# Patient Record
Sex: Female | Born: 1995 | Race: White | Hispanic: No | Marital: Single | Smoking: Never smoker
Health system: Southern US, Community
[De-identification: ages and names within clinical notes are randomized; demographics above are authoritative.]

## PROBLEM LIST (undated history)

## (undated) DIAGNOSIS — R011 Cardiac murmur, unspecified: Secondary | ICD-10-CM

## (undated) DIAGNOSIS — B9689 Other specified bacterial agents as the cause of diseases classified elsewhere: Secondary | ICD-10-CM

## (undated) DIAGNOSIS — F909 Attention-deficit hyperactivity disorder, unspecified type: Secondary | ICD-10-CM

---
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
## 2018-10-17 ENCOUNTER — Encounter (HOSPITAL_COMMUNITY): Payer: Self-pay

## 2018-10-17 ENCOUNTER — Ambulatory Visit (HOSPITAL_COMMUNITY)
Admission: EM | Admit: 2018-10-17 | Discharge: 2018-10-17 | Disposition: A | Discharge status: Home / Self Care | Payer: Medicaid Other | Attending: Family Medicine | Admitting: Family Medicine

## 2018-10-17 ENCOUNTER — Other Ambulatory Visit: Payer: Self-pay

## 2018-10-17 DIAGNOSIS — H1013 Acute atopic conjunctivitis, bilateral: Secondary | ICD-10-CM

## 2018-10-17 HISTORY — DX: Attention-deficit hyperactivity disorder, unspecified type: F90.9

## 2018-10-17 MED ORDER — OLOPATADINE HCL 0.2 % OP SOLN: 1.0000 [drp] | Freq: Every day | OPHTHALMIC | 0 refills | Status: AC

## 2018-10-17 MED ORDER — CETIRIZINE HCL 10 MG PO TABS: 10.0000 mg | ORAL_TABLET | Freq: Every day | ORAL | 0 refills | Status: AC

## 2018-10-17 NOTE — ED Provider Notes (Signed)
[facility]    CSN: [id number] Arrival date & time: 10/17/18  1138      History   Chief Complaint Chief Complaint  Patient presents with  . allergies    HPI Alice Mann is a 23 y.o. female.   HPI  Patient complains of allergies.  Itchy watery eyes for 3 or 4 days.  No purulent discharge.  No photophobia.  No visual defect.  No purulent discharge.  Mild runny nose.  Has not taken any medication.  Is otherwise healthy  Past Medical History:  Diagnosis Date  . ADHD   . Heart murmur     There are no active problems to display for this patient.   History reviewed. No pertinent surgical history.  OB History   No obstetric history on file.      Home Medications    Prior to Admission medications   Medication Sig Start Date End Date Taking? Authorizing Provider  cetirizine (ZYRTEC) 10 MG tablet Take 1 tablet (10 mg total) by mouth daily. 10/17/18   Eustace MooreNelson, Tadeusz Stahl Sue, MD  lisdexamfetamine (VYVANSE) 30 MG capsule Take 30 mg by mouth every morning.      [provider]  Olopatadine HCl 0.2 % SOLN Apply 1 drop to eye daily. 10/17/18   Eustace MooreNelson, Eirik Schueler Sue, MD    Family History Family History  Problem Relation Age of Onset  . Healthy Mother   . Healthy Father     Social History Social History   Tobacco Use  . Smoking status: Never Smoker  . Smokeless tobacco: Never Used  Substance Use Topics  . Alcohol use: Never    Frequency: Never  . Drug use: Never     Allergies   Patient has no known allergies.   Review of Systems Review of Systems  Constitutional: Negative for chills and fever.  HENT: Positive for rhinorrhea. Negative for ear pain and sore throat.   Eyes: Positive for itching. Negative for photophobia, pain, discharge, redness and visual disturbance.  Respiratory: Negative for cough and shortness of breath.   Cardiovascular: Negative for chest pain and palpitations.  Gastrointestinal: Negative for abdominal pain and vomiting.   Genitourinary: Negative for dysuria and hematuria.  Musculoskeletal: Negative for arthralgias and back pain.  Skin: Negative for color change and rash.  Neurological: Negative for seizures and syncope.  All other systems reviewed and are negative.    Physical Exam Triage Vital Signs ED Triage Vitals [10/17/18 1220]  Enc Vitals Group     BP 104/68     Pulse Rate 68     Resp 16     Temp 98 F (36.7 C)     Temp Source Oral     SpO2 98 %     Weight 111 lb (50.3 kg)     Height      Head Circumference      Peak Flow      Pain Score 2     Pain Loc      Pain Edu?      Excl. in GC?    No data found.  Updated Vital Signs BP 104/68 (BP Location: Right Arm)   Pulse 68   Temp 98 F (36.7 C) (Oral)   Resp 16   Wt 50.3 kg   LMP 10/12/2018   SpO2 98%      Physical Exam Constitutional:      General: She is not in acute distress.    Appearance: She is well-developed.  HENT:  Head: Normocephalic and atraumatic.     Right Ear: Tympanic membrane and ear canal normal.     Left Ear: Tympanic membrane and ear canal normal.     Nose: Nose normal. No congestion.     Mouth/Throat:     Mouth: Mucous membranes are moist.  Eyes:     Conjunctiva/sclera: Conjunctivae normal.     Pupils: Pupils are equal, round, and reactive to light.     Comments: Minimal eye findings.  No conjunctival injection.  No discharge.  Neck:     Musculoskeletal: Normal range of motion.  Cardiovascular:     Rate and Rhythm: Normal rate.  Pulmonary:     Effort: Pulmonary effort is normal. No respiratory distress.  Abdominal:     General: There is no distension.     Palpations: Abdomen is soft.  Musculoskeletal: Normal range of motion.  Skin:    General: Skin is warm and dry.  Neurological:     General: No focal deficit present.     Mental Status: She is alert.  Psychiatric:        Mood and Affect: Mood normal.      UC Treatments / Results  Labs (all labs ordered are listed, but only abnormal  results are displayed) Labs Reviewed - No data to display  EKG None  Radiology No results found.  Procedures Procedures (including critical care time)  Medications Ordered in UC Medications - No data to display  Initial Impression / Assessment and Plan / UC Course  I have reviewed the triage vital signs and the nursing notes.  Pertinent labs & imaging results that were available during my care of the patient were reviewed by me and considered in my medical decision making (see chart for details).     Offered allergy treatment and advised.  Return. Final Clinical Impressions(s) / UC Diagnoses   Final diagnoses:  Allergic conjunctivitis of both eyes     Discharge Instructions     Take Zyrtec once a day for allergy symptoms Use allergy eyedrops as needed.  Use every day that you have symptoms. Drink plenty of water Call or return if not improving   ED Prescriptions    Medication Sig Dispense Auth. Provider   cetirizine (ZYRTEC) 10 MG tablet Take 1 tablet (10 mg total) by mouth daily. 30 tablet Raylene Everts, MD   Olopatadine HCl 0.2 % SOLN Apply 1 drop to eye daily. 2.5 mL Raylene Everts, MD     Controlled Substance Prescriptions [place] Controlled Substance Registry consulted? Not Applicable   Raylene Everts, MD 10/17/18 1321

## 2018-10-17 NOTE — ED Triage Notes (Signed)
Pt states states her eyes have been itching and watery eyes x 4 days.

## 2018-10-17 NOTE — Discharge Instructions (Signed)
Take Zyrtec once a day for allergy symptoms Use allergy eyedrops as needed.  Use every day that you have symptoms. Drink plenty of water Call or return if not improving

## 2019-09-22 ENCOUNTER — Ambulatory Visit: Payer: Medicaid Other | Attending: Internal Medicine

## 2019-09-22 ENCOUNTER — Ambulatory Visit: Payer: Medicaid Other

## 2019-09-22 DIAGNOSIS — Z23 Encounter for immunization: Secondary | ICD-10-CM

## 2019-09-22 NOTE — Progress Notes (Signed)
   Covid-19 Vaccination Clinic  Name:  Alice Mann    MRN: [id number] DOB: 1995/12/27  09/22/2019  [name] was observed post Covid-19 immunization for 15 minutes without incident. She was provided with Vaccine Information Sheet and instruction to access the V-Safe system.   [name] was instructed to call 911 with any severe reactions post vaccine: Marland Kitchen Difficulty breathing  . Swelling of face and throat  . A fast heartbeat  . A bad rash all over body  . Dizziness and weakness   Immunizations Administered    Name Date Dose VIS Date Route   JANSSEN COVID-19 VACCINE 09/22/2019 10:41 AM 0.5 mL 06/25/2019 Intramuscular   Manufacturer: [employer]   Lot: [id number]   NDC: 63335-456-25

## 2020-04-23 ENCOUNTER — Other Ambulatory Visit: Payer: Self-pay

## 2020-04-23 ENCOUNTER — Ambulatory Visit (HOSPITAL_COMMUNITY)
Admission: EM | Admit: 2020-04-23 | Discharge: 2020-04-23 | Disposition: A | Discharge status: Home / Self Care | Payer: Medicaid Other | Attending: Student | Admitting: Student

## 2020-04-23 ENCOUNTER — Encounter (HOSPITAL_COMMUNITY): Payer: Self-pay | Admitting: Emergency Medicine

## 2020-04-23 DIAGNOSIS — B9689 Other specified bacterial agents as the cause of diseases classified elsewhere: Secondary | ICD-10-CM | POA: Diagnosis not present

## 2020-04-23 DIAGNOSIS — N760 Acute vaginitis: Secondary | ICD-10-CM | POA: Diagnosis present

## 2020-04-23 MED ORDER — METRONIDAZOLE 500 MG PO TABS: 500.0000 mg | ORAL_TABLET | Freq: Two times a day (BID) | ORAL | 0 refills | Status: DC

## 2020-04-23 NOTE — ED Provider Notes (Signed)
[facility]    CSN: [id number] Arrival date & time: 04/23/20  1259      History   Chief Complaint Chief Complaint  Patient presents with  . Vaginal Discharge    HPI Alice Mann is a 24 y.o. female presenting for abnormal vaginal discharge x3 weeks. History of ADHD and heart murmur. States she's had foul fishy smelling creamy white discharge for 3 weeks. Endorses lower abd pain intermittently. occ external itching but no bleeding. States she was diagnosed with BV few weeks ago and stopped taking them; thinks the BV has come back. Denies hematuria, dysuria, frequency, urgency, back pain, n/v/d/abd pain, fevers/chills.   HPI  Past Medical History:  Diagnosis Date  . ADHD   . Heart murmur     There are no problems to display for this patient.   History reviewed. No pertinent surgical history.  OB History   No obstetric history on file.      Home Medications    Prior to Admission medications   Medication Sig Start Date End Date Taking? Authorizing Provider  metroNIDAZOLE (FLAGYL) 500 MG tablet Take 1 tablet (500 mg total) by mouth 2 (two) times daily. 04/23/20  Yes Rhys Martini, PA-C  cetirizine (ZYRTEC) 10 MG tablet Take 1 tablet (10 mg total) by mouth daily. 10/17/18   Eustace Moore, MD  lisdexamfetamine (VYVANSE) 30 MG capsule Take 30 mg by mouth every morning.    [provider]  Olopatadine HCl 0.2 % SOLN Apply 1 drop to eye daily. 10/17/18   Eustace Moore, MD    Family History Family History  Problem Relation Age of Onset  . Healthy Mother   . Healthy Father     Social History Social History   Tobacco Use  . Smoking status: Never Smoker  . Smokeless tobacco: Never Used  Substance Use Topics  . Alcohol use: Never  . Drug use: Never     Allergies   Patient has no known allergies.   Review of Systems Review of Systems  Genitourinary: Positive for vaginal discharge.  All other systems reviewed and are  negative.    Physical Exam Triage Vital Signs ED Triage Vitals  Enc Vitals Group     BP 04/23/20 1552 103/66     Pulse Rate 04/23/20 1552 65     Resp 04/23/20 1552 13     Temp 04/23/20 1552 98 F (36.7 C)     Temp Source 04/23/20 1552 Oral     SpO2 04/23/20 1552 95 %     Weight 04/23/20 1549 116 lb (52.6 kg)     Height 04/23/20 1549 5\' 4"  (1.626 m)     Head Circumference --      Peak Flow --      Pain Score 04/23/20 1549 5     Pain Loc --      Pain Edu? --      Excl. in GC? --    No data found.  Updated Vital Signs BP 103/66 (BP Location: Left Arm)   Pulse 65   Temp 98 F (36.7 C) (Oral)   Resp 13   Ht 5\' 4"  (1.626 m)   Wt 116 lb (52.6 kg)   LMP 04/05/2020   SpO2 95%   BMI 19.91 kg/m   Visual Acuity Right Eye Distance:   Left Eye Distance:   Bilateral Distance:    Right Eye Near:   Left Eye Near:    Bilateral Near:  Physical Exam Vitals reviewed.  Constitutional:      General: She is not in acute distress.    Appearance: Normal appearance. She is not ill-appearing.  HENT:     Head: Normocephalic and atraumatic.  Cardiovascular:     Rate and Rhythm: Normal rate and regular rhythm.  Pulmonary:     Effort: Pulmonary effort is normal.     Breath sounds: Normal breath sounds.  Abdominal:     General: Bowel sounds are normal. There is no distension.     Palpations: Abdomen is soft. There is no mass.     Tenderness: There is no abdominal tenderness. There is no right CVA tenderness, left CVA tenderness, guarding or rebound.  Neurological:     General: No focal deficit present.     Mental Status: She is alert and oriented to person, place, and time. Mental status is at baseline.  Psychiatric:        Mood and Affect: Mood normal.        Behavior: Behavior normal.        Thought Content: Thought content normal.        Judgment: Judgment normal.      UC Treatments / Results  Labs (all labs ordered are listed, but only abnormal results are  displayed) Labs Reviewed  CERVICOVAGINAL ANCILLARY ONLY    EKG   Radiology No results found.  Procedures Procedures (including critical care time)  Medications Ordered in UC Medications - No data to display  Initial Impression / Assessment and Plan / UC Course  I have reviewed the triage vital signs and the nursing notes.  Pertinent labs & imaging results that were available during my care of the patient were reviewed by me and considered in my medical decision making (see chart for details).     Pt with recent history of BV diagnosed at [facility]. This was treated with flagyl but she lost pills halfway through and her symptoms returned. Script for flagyl sent. She understands to avoid alcohol while on this medication.   We have sent testing for sexually transmitted infections. We will notify you of any positive results once they are received. If required, we will prescribe any medications you might need.   Please refrain from all sexual activity for at least the next seven days.  Seek additional medical attention if you develop fevers/chills, new/worsening abdominal pain, new/worsening vaginal discomfort/discharge, etc. Patient verbalizes understanding and agreement.  She denies STI risk and states she is not pregnant; urine pregnancy deferred.   Final Clinical Impressions(s) / UC Diagnoses   Final diagnoses:  BV (bacterial vaginosis)     Discharge Instructions     Start the flagyl. You'll take this twice a day for 7 days.     ED Prescriptions    Medication Sig Dispense Auth. Provider   metroNIDAZOLE (FLAGYL) 500 MG tablet Take 1 tablet (500 mg total) by mouth 2 (two) times daily. 14 tablet Rhys Martini, PA-C     PDMP not reviewed this encounter.   Rhys Martini, PA-C 04/23/20 1649

## 2020-04-23 NOTE — Discharge Instructions (Addendum)
Start the flagyl. You'll take this twice a day for 7 days.

## 2020-04-23 NOTE — ED Triage Notes (Signed)
Patient c/o abnormal vaginal discharge x 3 weeks.   Patient endorses foul smelling discharge.   Patient endorses "creamy white discharge".  Patient endorses intermittent lower ABD pain.   Patient states "I lost my BV pills a few weeks ago when I was in the middle of taking them and I think the BV came back".

## 2020-04-24 LAB — CERVICOVAGINAL ANCILLARY ONLY
Bacterial Vaginitis (gardnerella): POSITIVE — AB
Candida Glabrata: NEGATIVE
Candida Vaginitis: NEGATIVE
Chlamydia: NEGATIVE
Comment: NEGATIVE
Comment: NEGATIVE
Comment: NEGATIVE
Comment: NEGATIVE
Comment: NEGATIVE
Comment: NORMAL
Neisseria Gonorrhea: NEGATIVE
Trichomonas: NEGATIVE

## 2020-05-06 ENCOUNTER — Encounter (HOSPITAL_COMMUNITY): Payer: Self-pay | Admitting: *Deleted

## 2020-05-06 ENCOUNTER — Ambulatory Visit (HOSPITAL_COMMUNITY)
Admission: EM | Admit: 2020-05-06 | Discharge: 2020-05-06 | Disposition: A | Discharge status: Home / Self Care | Payer: Medicaid Other | Attending: Internal Medicine | Admitting: Internal Medicine

## 2020-05-06 ENCOUNTER — Other Ambulatory Visit: Payer: Self-pay

## 2020-05-06 DIAGNOSIS — J069 Acute upper respiratory infection, unspecified: Secondary | ICD-10-CM | POA: Diagnosis present

## 2020-05-06 DIAGNOSIS — Z113 Encounter for screening for infections with a predominantly sexual mode of transmission: Secondary | ICD-10-CM | POA: Diagnosis present

## 2020-05-06 DIAGNOSIS — Z20822 Contact with and (suspected) exposure to covid-19: Secondary | ICD-10-CM | POA: Insufficient documentation

## 2020-05-06 DIAGNOSIS — Z3202 Encounter for pregnancy test, result negative: Secondary | ICD-10-CM

## 2020-05-06 LAB — HIV ANTIBODY (ROUTINE TESTING W REFLEX): HIV Screen 4th Generation wRfx: NONREACTIVE

## 2020-05-06 LAB — SARS CORONAVIRUS 2 (TAT 6-24 HRS): SARS Coronavirus 2: NEGATIVE

## 2020-05-06 LAB — POC URINE PREG, ED: Preg Test, Ur: NEGATIVE

## 2020-05-06 MED ORDER — PREDNISONE 20 MG PO TABS: 40.0000 mg | ORAL_TABLET | Freq: Every day | ORAL | 0 refills | Status: DC

## 2020-05-06 NOTE — ED Triage Notes (Signed)
Pt request a STD test and Pt reports still having cough and sore throat. Pt's COVID test WED. Was neg.

## 2020-05-06 NOTE — ED Provider Notes (Signed)
[facility]    CSN: [id number] Arrival date & time: 05/06/20  1316      History   Chief Complaint Chief Complaint  Patient presents with  . Cough  . Sore Throat  . Exposure to STD    HPI Alice Mann is a 25 y.o. female.   Here today with 5 day history of sore throat, sinus pressure and congestion, runny nose, headache. Tested for COVID last week day before sxs started which was negative. Denies fever, chills, CP, SOB, abdominal pain, N/V/D. Not trying anything OTC for sxs. No known sick contacts. Also wanting STI testing as she had unprotected intercourse with a new partner. Asymptomatic.      Past Medical History:  Diagnosis Date  . ADHD   . Heart murmur     There are no problems to display for this patient.   History reviewed. No pertinent surgical history.  OB History   No obstetric history on file.      Home Medications    Prior to Admission medications   Medication Sig Start Date End Date Taking? Authorizing Provider  predniSONE (DELTASONE) 20 MG tablet Take 2 tablets (40 mg total) by mouth daily with breakfast. 05/06/20  Yes Particia Nearing, PA-C  cetirizine (ZYRTEC) 10 MG tablet Take 1 tablet (10 mg total) by mouth daily. 10/17/18   Eustace Moore, MD  lisdexamfetamine (VYVANSE) 30 MG capsule Take 30 mg by mouth every morning.    [provider]  metroNIDAZOLE (FLAGYL) 500 MG tablet Take 1 tablet (500 mg total) by mouth 2 (two) times daily. 04/23/20   Rhys Martini, PA-C  Olopatadine HCl 0.2 % SOLN Apply 1 drop to eye daily. 10/17/18   Eustace Moore, MD    Family History Family History  Problem Relation Age of Onset  . Healthy Mother   . Healthy Father     Social History Social History   Tobacco Use  . Smoking status: Never Smoker  . Smokeless tobacco: Never Used  Substance Use Topics  . Alcohol use: Never  . Drug use: Never     Allergies   Patient has no known allergies.   Review of  Systems Review of Systems PER HPI    Physical Exam Triage Vital Signs ED Triage Vitals  Enc Vitals Group     BP 05/06/20 1527 (!) 120/58     Pulse Rate 05/06/20 1527 63     Resp 05/06/20 1527 18     Temp 05/06/20 1527 98.3 F (36.8 C)     Temp Source 05/06/20 1527 Oral     SpO2 05/06/20 1527 100 %     Weight --      Height --      Head Circumference --      Peak Flow --      Pain Score 05/06/20 1529 0     Pain Loc --      Pain Edu? --      Excl. in GC? --    No data found.  Updated Vital Signs BP (!) 120/58 (BP Location: Right Arm)   Pulse 63   Temp 98.3 F (36.8 C) (Oral)   Resp 18   LMP 05/06/2020   SpO2 100%   Visual Acuity Right Eye Distance:   Left Eye Distance:   Bilateral Distance:    Right Eye Near:   Left Eye Near:    Bilateral Near:     Physical Exam Vitals and nursing note reviewed.  Constitutional:      Appearance: Normal appearance. She is not ill-appearing.  HENT:     Head: Atraumatic.     Right Ear: Tympanic membrane normal.     Left Ear: Tympanic membrane normal.     Nose: Rhinorrhea present.     Mouth/Throat:     Mouth: Mucous membranes are moist.     Pharynx: Posterior oropharyngeal erythema present.  Eyes:     Extraocular Movements: Extraocular movements intact.     Conjunctiva/sclera: Conjunctivae normal.  Cardiovascular:     Rate and Rhythm: Normal rate and regular rhythm.     Heart sounds: Normal heart sounds.  Pulmonary:     Effort: Pulmonary effort is normal. No respiratory distress.     Breath sounds: Normal breath sounds. No wheezing.  Abdominal:     General: Bowel sounds are normal. There is no distension.     Palpations: Abdomen is soft.     Tenderness: There is no abdominal tenderness. There is no guarding.  Musculoskeletal:        General: Normal range of motion.     Cervical back: Normal range of motion and neck supple.  Skin:    General: Skin is warm and dry.  Neurological:     Mental Status: She is alert and  oriented to person, place, and time.  Psychiatric:        Mood and Affect: Mood normal.        Thought Content: Thought content normal.        Judgment: Judgment normal.      UC Treatments / Results  Labs (all labs ordered are listed, but only abnormal results are displayed) Labs Reviewed  SARS CORONAVIRUS 2 (TAT 6-24 HRS)  HIV ANTIBODY (ROUTINE TESTING W REFLEX)  RPR  POC URINE PREG, ED  CERVICOVAGINAL ANCILLARY ONLY    EKG   Radiology No results found.  Procedures Procedures (including critical care time)  Medications Ordered in UC Medications - No data to display  Initial Impression / Assessment and Plan / UC Course  I have reviewed the triage vital signs and the nursing notes.  Pertinent labs & imaging results that were available during my care of the patient were reviewed by me and considered in my medical decision making (see chart for details).     Will restest for COVID as sxs began after her last test, isolate, work note given. Prednisone burst for her ongoing sinus sxs and reviewed dayquil, zyrtec, etc. STI testing pending, encouraged abstinence while awaiting these results. Treat as needed.  Final Clinical Impressions(s) / UC Diagnoses   Final diagnoses:  Viral URI with cough  Routine screening for STI (sexually transmitted infection)   Discharge Instructions   None    ED Prescriptions    Medication Sig Dispense Auth. Provider   predniSONE (DELTASONE) 20 MG tablet Take 2 tablets (40 mg total) by mouth daily with breakfast. 10 tablet Particia Nearing, [place]     PDMP not reviewed this encounter.   Particia Nearing, [place] 05/06/20 1626

## 2020-05-07 LAB — CERVICOVAGINAL ANCILLARY ONLY
Bacterial Vaginitis (gardnerella): POSITIVE — AB
Candida Glabrata: NEGATIVE
Candida Vaginitis: NEGATIVE
Chlamydia: NEGATIVE
Comment: NEGATIVE
Comment: NEGATIVE
Comment: NEGATIVE
Comment: NEGATIVE
Comment: NEGATIVE
Comment: NORMAL
Neisseria Gonorrhea: NEGATIVE
Trichomonas: NEGATIVE

## 2020-05-07 LAB — RPR: RPR Ser Ql: NONREACTIVE

## 2020-05-08 ENCOUNTER — Telehealth (HOSPITAL_COMMUNITY): Payer: Self-pay

## 2020-05-08 MED ORDER — METRONIDAZOLE 500 MG PO TABS
500.0000 mg | ORAL_TABLET | Freq: Two times a day (BID) | ORAL | 0 refills | Status: DC
Start: 2020-05-08 — End: 2020-06-12

## 2020-06-12 ENCOUNTER — Ambulatory Visit (HOSPITAL_COMMUNITY)
Admission: EM | Admit: 2020-06-12 | Discharge: 2020-06-12 | Disposition: A | Discharge status: Home / Self Care | Payer: Medicaid Other | Attending: Physician Assistant | Admitting: Physician Assistant

## 2020-06-12 ENCOUNTER — Encounter (HOSPITAL_COMMUNITY): Payer: Self-pay

## 2020-06-12 ENCOUNTER — Other Ambulatory Visit: Payer: Self-pay

## 2020-06-12 DIAGNOSIS — N760 Acute vaginitis: Secondary | ICD-10-CM

## 2020-06-12 DIAGNOSIS — B9689 Other specified bacterial agents as the cause of diseases classified elsewhere: Secondary | ICD-10-CM | POA: Diagnosis not present

## 2020-06-12 MED ORDER — FLUCONAZOLE 150 MG PO TABS: 150.0000 mg | ORAL_TABLET | Freq: Once | ORAL | 0 refills | Status: AC

## 2020-06-12 MED ORDER — METRONIDAZOLE 0.75 % VA GEL: 1.0000 | Freq: Two times a day (BID) | VAGINAL | 0 refills | Status: DC

## 2020-06-12 NOTE — ED Provider Notes (Signed)
[facility]    CSN: [id number] Arrival date & time: 06/12/20  1634      History   Chief Complaint Chief Complaint  Patient presents with  . Vaginitis    HPI Alice Mann is a 25 y.o. female.   Pt complains of vaginal itching and foul smell that started two days ago.  Also complains of clear discharge. She reports she was seen at a different UC 05/09/19 and diagnosed with BV.  Metrogel sent to the pharmacy, but pt reports it exploded before she was able to use it. Denies abdominal pain, n/v/d, vaginal bleeding, pelvic pain.       Past Medical History:  Diagnosis Date  . ADHD   . Heart murmur     There are no problems to display for this patient.   History reviewed. No pertinent surgical history.  OB History   No obstetric history on file.      Home Medications    Prior to Admission medications   Medication Sig Start Date End Date Taking? Authorizing Provider  fluconazole (DIFLUCAN) 150 MG tablet Take 1 tablet (150 mg total) by mouth once for 1 dose. Take 1 tablet by mouth, if no improvement after 72 hours may take the second pill. 06/12/20 06/12/20 Yes Maison Agrusa, Shanda Bumps, PA-C  metroNIDAZOLE (METROGEL VAGINAL) 0.75 % vaginal gel Place 1 Applicatorful vaginally 2 (two) times daily. 06/12/20  Yes Johnmatthew Solorio, PA-C  cetirizine (ZYRTEC) 10 MG tablet Take 1 tablet (10 mg total) by mouth daily. 10/17/18   Eustace Moore, MD  lisdexamfetamine (VYVANSE) 30 MG capsule Take 30 mg by mouth every morning.    [provider]  Olopatadine HCl 0.2 % SOLN Apply 1 drop to eye daily. 10/17/18   Eustace Moore, MD  predniSONE (DELTASONE) 20 MG tablet Take 2 tablets (40 mg total) by mouth daily with breakfast. 05/06/20   Particia Nearing, PA-C    Family History Family History  Problem Relation Age of Onset  . Healthy Mother   . Healthy Father     Social History Social History   Tobacco Use  . Smoking status: Never Smoker  . Smokeless tobacco:  Never Used  Substance Use Topics  . Alcohol use: Never  . Drug use: Never     Allergies   Patient has no known allergies.   Review of Systems Review of Systems  Constitutional: Negative for chills and fever.  HENT: Negative for ear pain and sore throat.   Eyes: Negative for pain and visual disturbance.  Respiratory: Negative for cough and shortness of breath.   Cardiovascular: Negative for chest pain and palpitations.  Gastrointestinal: Negative for abdominal pain and vomiting.  Genitourinary: Positive for vaginal discharge (vaginal itching). Negative for dysuria, hematuria, pelvic pain, vaginal bleeding and vaginal pain.  Musculoskeletal: Negative for arthralgias and back pain.  Skin: Negative for color change and rash.  Neurological: Negative for seizures and syncope.  All other systems reviewed and are negative.    Physical Exam Triage Vital Signs ED Triage Vitals [06/12/20 1711]  Enc Vitals Group     BP 111/65     Pulse Rate 69     Resp 18     Temp 97.8 F (36.6 C)     Temp Source Oral     SpO2 99 %     Weight      Height      Head Circumference      Peak Flow      Pain  Score 0     Pain Loc      Pain Edu?      Excl. in GC?    No data found.  Updated Vital Signs BP 111/65 (BP Location: Right Arm)   Pulse 69   Temp 97.8 F (36.6 C) (Oral)   Resp 18   LMP 06/02/2020   SpO2 99%   Visual Acuity Right Eye Distance:   Left Eye Distance:   Bilateral Distance:    Right Eye Near:   Left Eye Near:    Bilateral Near:     Physical Exam Vitals and nursing note reviewed.  Constitutional:      General: She is not in acute distress.    Appearance: She is well-developed and well-nourished.  HENT:     Head: Normocephalic and atraumatic.  Eyes:     Conjunctiva/sclera: Conjunctivae normal.  Cardiovascular:     Rate and Rhythm: Normal rate and regular rhythm.     Heart sounds: No murmur heard.   Pulmonary:     Effort: Pulmonary effort is normal. No  respiratory distress.     Breath sounds: Normal breath sounds.  Abdominal:     Palpations: Abdomen is soft.     Tenderness: There is no abdominal tenderness.  Musculoskeletal:        General: No edema.     Cervical back: Neck supple.  Skin:    General: Skin is warm and dry.  Neurological:     Mental Status: She is alert.  Psychiatric:        Mood and Affect: Mood and affect normal.      UC Treatments / Results  Labs (all labs ordered are listed, but only abnormal results are displayed) Labs Reviewed  CERVICOVAGINAL ANCILLARY ONLY    EKG   Radiology No results found.  Procedures Procedures (including critical care time)  Medications Ordered in UC Medications - No data to display  Initial Impression / Assessment and Plan / UC Course  I have reviewed the triage vital signs and the nursing notes.  Pertinent labs & imaging results that were available during my care of the patient were reviewed by me and considered in my medical decision making (see chart for details).     Will treat for presumptive BV and possible yeast infection due to itching.  Test results pending, will change treatment plan if necessary.  Return precautions discussed.  Final Clinical Impressions(s) / UC Diagnoses   Final diagnoses:  Bacterial vaginitis     Discharge Instructions     Will call with test results and change treatment plan if needed.    ED Prescriptions    Medication Sig Dispense Auth. Provider   metroNIDAZOLE (METROGEL VAGINAL) 0.75 % vaginal gel Place 1 Applicatorful vaginally 2 (two) times daily. 70 g Chimaobi Casebolt, PA-C   fluconazole (DIFLUCAN) 150 MG tablet Take 1 tablet (150 mg total) by mouth once for 1 dose. Take 1 tablet by mouth, if no improvement after 72 hours may take the second pill. 2 tablet Jodell Cipro, PA-C     PDMP not reviewed this encounter.   Jodell Cipro, PA-C 06/12/20 1741

## 2020-06-12 NOTE — ED Triage Notes (Signed)
Pt presents with vaginal irritation and odor X 2 days.

## 2020-06-12 NOTE — Discharge Instructions (Addendum)
Will call with test results and change treatment plan if needed.

## 2020-06-13 LAB — CERVICOVAGINAL ANCILLARY ONLY
Bacterial Vaginitis (gardnerella): POSITIVE — AB
Candida Glabrata: NEGATIVE
Candida Vaginitis: POSITIVE — AB
Chlamydia: NEGATIVE
Comment: NEGATIVE
Comment: NEGATIVE
Comment: NEGATIVE
Comment: NEGATIVE
Comment: NEGATIVE
Comment: NORMAL
Neisseria Gonorrhea: NEGATIVE
Trichomonas: NEGATIVE

## 2020-08-28 ENCOUNTER — Encounter (HOSPITAL_COMMUNITY): Payer: Self-pay | Admitting: Emergency Medicine

## 2020-08-28 ENCOUNTER — Ambulatory Visit (HOSPITAL_COMMUNITY)
Admission: EM | Admit: 2020-08-28 | Discharge: 2020-08-28 | Disposition: A | Discharge status: Home / Self Care | Payer: Medicaid Other | Attending: Physician Assistant | Admitting: Physician Assistant

## 2020-08-28 ENCOUNTER — Other Ambulatory Visit: Payer: Self-pay

## 2020-08-28 DIAGNOSIS — N760 Acute vaginitis: Secondary | ICD-10-CM | POA: Insufficient documentation

## 2020-08-28 DIAGNOSIS — Z113 Encounter for screening for infections with a predominantly sexual mode of transmission: Secondary | ICD-10-CM | POA: Diagnosis not present

## 2020-08-28 DIAGNOSIS — N898 Other specified noninflammatory disorders of vagina: Secondary | ICD-10-CM | POA: Insufficient documentation

## 2020-08-28 DIAGNOSIS — B9689 Other specified bacterial agents as the cause of diseases classified elsewhere: Secondary | ICD-10-CM | POA: Diagnosis present

## 2020-08-28 LAB — HEPATITIS PANEL, ACUTE
HCV Ab: NONREACTIVE
Hep A IgM: NONREACTIVE
Hep B C IgM: NONREACTIVE
Hepatitis B Surface Ag: NONREACTIVE

## 2020-08-28 LAB — HIV ANTIBODY (ROUTINE TESTING W REFLEX): HIV Screen 4th Generation wRfx: NONREACTIVE

## 2020-08-28 MED ORDER — METRONIDAZOLE 0.75 % VA GEL: 1.0000 | Freq: Every day | VAGINAL | 0 refills | Status: DC

## 2020-08-28 NOTE — ED Provider Notes (Signed)
[facility]    CSN: [id number] Arrival date & time: 08/28/20  1338      History   Chief Complaint Chief Complaint  Patient presents with  . Vaginal Discharge  . Vaginal Odor    HPI Alice Mann is a 25 y.o. female.   Patient presents today with a several day history of vaginal odor.  Reports associated clear discharge.  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  She has no concern for pregnancy as LMP 08/22/2020.  She is followed by OB/GYN and on OCP.  She has previously taken both metronidazole gel and tablets but struggles with side effects from systemic medication so prefers a job.  She does report that she is sexually active and does not consistently use condoms.  She is open to full STI panel today.  She denies any changes to personal hygiene products including tampons, soaps, detergents.  Denies any recent antibiotic use.  She denies any fever, abdominal pain, pelvic pain, urinary symptoms, nausea, vomiting.     Past Medical History:  Diagnosis Date  . ADHD   . Heart murmur     There are no problems to display for this patient.   History reviewed. No pertinent surgical history.  OB History   No obstetric history on file.      Home Medications    Prior to Admission medications   Medication Sig Start Date End Date Taking? Authorizing Provider  levonorgestrel-ethinyl estradiol (SEASONALE) 0.15-0.03 MG tablet Take 1 tablet by mouth daily. 08/24/20  Yes [provider]  cetirizine (ZYRTEC) 10 MG tablet Take 1 tablet (10 mg total) by mouth daily. 10/17/18   Eustace Moore, MD  lisdexamfetamine (VYVANSE) 30 MG capsule Take 30 mg by mouth every morning.    [provider]  metroNIDAZOLE (METROGEL VAGINAL) 0.75 % vaginal gel Place 1 Applicatorful vaginally at bedtime. 08/28/20   Chidera Dearcos, Noberto Retort, PA-C  Olopatadine HCl 0.2 % SOLN  Apply 1 drop to eye daily. 10/17/18   Eustace Moore, MD  predniSONE (DELTASONE) 20 MG tablet Take 2 tablets (40 mg total) by mouth daily with breakfast. 05/06/20   Particia Nearing, PA-C    Family History Family History  Problem Relation Age of Onset  . Healthy Mother   . Healthy Father     Social History Social History   Tobacco Use  . Smoking status: Never Smoker  . Smokeless tobacco: Never Used  Substance Use Topics  . Alcohol use: Never  . Drug use: Never     Allergies   Patient has no known allergies.   Review of Systems Review of Systems  Constitutional: Negative for activity change, appetite change, fatigue and fever.  Respiratory: Negative for cough and shortness of breath.   Cardiovascular: Negative for chest pain.  Gastrointestinal: Negative for abdominal pain, diarrhea, nausea and vomiting.  Genitourinary: Positive for vaginal discharge. Negative for frequency, pelvic pain, urgency, vaginal bleeding and vaginal pain.  Neurological: Negative for dizziness, light-headedness and headaches.     Physical Exam Triage Vital Signs ED Triage Vitals  Enc Vitals Group     BP 08/28/20 1511 111/70     Pulse Rate 08/28/20 1511 65     Resp 08/28/20 1511 16     Temp 08/28/20 1511 98.8 F (37.1 C)     Temp Source 08/28/20 1511 Oral     SpO2 08/28/20  1511 100 %     Weight --      Height --      Head Circumference --      Peak Flow --      Pain Score 08/28/20 1509 0     Pain Loc --      Pain Edu? --      Excl. in GC? --    No data found.  Updated Vital Signs BP 111/70 (BP Location: Right Arm)   Pulse 65   Temp 98.8 F (37.1 C) (Oral)   Resp 16   LMP 08/22/2020   SpO2 100%   Visual Acuity Right Eye Distance:   Left Eye Distance:   Bilateral Distance:    Right Eye Near:   Left Eye Near:    Bilateral Near:     Physical Exam Vitals reviewed.  Constitutional:      General: She is awake. She is not in acute distress.    Appearance: Normal  appearance. She is not ill-appearing.     Comments: Very pleasant female appears stated age in no acute distress  HENT:     Head: Normocephalic and atraumatic.  Cardiovascular:     Rate and Rhythm: Normal rate and regular rhythm.     Heart sounds: No murmur heard.   Pulmonary:     Effort: Pulmonary effort is normal.     Breath sounds: Normal breath sounds. No wheezing, rhonchi or rales.     Comments: Clear to auscultation bilaterally Abdominal:     General: Bowel sounds are normal.     Palpations: Abdomen is soft.     Tenderness: There is no abdominal tenderness. There is no right CVA tenderness, left CVA tenderness, guarding or rebound.     Comments: Benign abdominal exam  Genitourinary:    Comments: Exam deferred Psychiatric:        Behavior: Behavior is cooperative.      UC Treatments / Results  Labs (all labs ordered are listed, but only abnormal results are displayed) Labs Reviewed  HIV ANTIBODY (ROUTINE TESTING W REFLEX)  RPR  HEPATITIS PANEL, ACUTE  CERVICOVAGINAL ANCILLARY ONLY    EKG   Radiology No results found.  Procedures Procedures (including critical care time)  Medications Ordered in UC Medications - No data to display  Initial Impression / Assessment and Plan / UC Course  I have reviewed the triage vital signs and the nursing notes.  Pertinent labs & imaging results that were available during my care of the patient were reviewed by me and considered in my medical decision making (see chart for details).     Patient has a history of recurrent BV and states current symptoms are similar previous episodes of this condition so will empirically treat with MetroGel vaginal given she has difficulty with systemic metronidazole.  She was encouraged to wear loosefitting cotton underwear use hypoallergenic soaps and detergents.  Discussed potential utility of boric acid suppositories and encouraged her to discuss this with OB/GYN to follow-up as scheduled.   She was instructed to not drink any alcohol while using MetroGel due to Antabuse side effects associated with metronidazole.  Full STI panel obtained today-results pending.  Strict return precautions given to which patient expressed understanding.  Final Clinical Impressions(s) / UC Diagnoses   Final diagnoses:  BV (bacterial vaginosis)  Vaginal discharge  Routine screening for STI (sexually transmitted infection)     Discharge Instructions     Use 1 applicator of metronidazole gel nightly for a minimum  of 7 days.  Use hypoallergenic soaps and detergents.  Wear loosefitting cotton underwear.  You should avoid alcohol while using this medication and for 72 hours after completing course.  Since you continue to have recurrent infections please follow-up with your OB/GYN as we discussed.  We will be in touch with your other test results as soon as we have them if additional treatment is required.    ED Prescriptions    Medication Sig Dispense Auth. Provider   metroNIDAZOLE (METROGEL VAGINAL) 0.75 % vaginal gel Place 1 Applicatorful vaginally at bedtime. 70 g Terran Klinke, Noberto Retort, PA-C     PDMP not reviewed this encounter.   Jeani Hawking, PA-C 08/28/20 1545

## 2020-08-28 NOTE — Discharge Instructions (Addendum)
Use 1 applicator of metronidazole gel nightly for a minimum of 7 days.  Use hypoallergenic soaps and detergents.  Wear loosefitting cotton underwear.  You should avoid alcohol while using this medication and for 72 hours after completing course.  Since you continue to have recurrent infections please follow-up with your OB/GYN as we discussed.  We will be in touch with your other test results as soon as we have them if additional treatment is required.

## 2020-08-28 NOTE — ED Triage Notes (Addendum)
Pt presents with vaginal discharge and odor. States has hx of BV.

## 2020-08-29 LAB — CERVICOVAGINAL ANCILLARY ONLY
Bacterial Vaginitis (gardnerella): POSITIVE — AB
Candida Glabrata: NEGATIVE
Candida Vaginitis: NEGATIVE
Chlamydia: NEGATIVE
Comment: NEGATIVE
Comment: NEGATIVE
Comment: NEGATIVE
Comment: NEGATIVE
Comment: NEGATIVE
Comment: NORMAL
Neisseria Gonorrhea: NEGATIVE
Trichomonas: NEGATIVE

## 2020-08-29 LAB — RPR: RPR Ser Ql: NONREACTIVE

## 2020-10-15 ENCOUNTER — Ambulatory Visit
Admission: EM | Admit: 2020-10-15 | Discharge: 2020-10-15 | Disposition: A | Discharge status: Home / Self Care | Payer: Medicaid Other | Attending: Emergency Medicine | Admitting: Emergency Medicine

## 2020-10-15 ENCOUNTER — Other Ambulatory Visit: Payer: Self-pay

## 2020-10-15 ENCOUNTER — Encounter: Payer: Self-pay | Admitting: Emergency Medicine

## 2020-10-15 DIAGNOSIS — N760 Acute vaginitis: Secondary | ICD-10-CM | POA: Insufficient documentation

## 2020-10-15 DIAGNOSIS — Z113 Encounter for screening for infections with a predominantly sexual mode of transmission: Secondary | ICD-10-CM | POA: Insufficient documentation

## 2020-10-15 LAB — POCT URINE PREGNANCY: Preg Test, Ur: NEGATIVE

## 2020-10-15 MED ORDER — METRONIDAZOLE 0.75 % VA GEL: 1.0000 | Freq: Every day | VAGINAL | 0 refills | Status: AC

## 2020-10-15 NOTE — Discharge Instructions (Addendum)
MetroGel at bedtime x5 days for BV  We are testing you for HIV and Syphillis, Gonorrhea, Chlamydia, Trichomonas, Yeast and Bacterial Vaginosis. We will call you if anything is positive and let you know if you require any further treatment. Please inform partners of any positive results.   Please return if symptoms not improving with treatment, development of fever, nausea, vomiting, abdominal pain.

## 2020-10-15 NOTE — ED Provider Notes (Signed)
Alice Mann    CSN: [id number] Arrival date & time: 10/15/20  0846      History   Chief Complaint Chief Complaint  Patient presents with   Vaginal Discharge    HPI Alice Mann is a 25 y.o. female presenting today for evaluation of vaginal irritation.  Reports over the past 3 days has developed vaginal irritation discharge and odor similar to past bacterial vaginosis infections.  She reports recent unprotected intercourse with ex. she also reports recently stopping birth control pills in May and has not been taking them recently.  HPI  Past Medical History:  Diagnosis Date   ADHD    Heart murmur     There are no problems to display for this patient.   History reviewed. No pertinent surgical history.  OB History   No obstetric history on file.      Home Medications    Prior to Admission medications   Medication Sig Start Date End Date Taking? Authorizing Provider  metroNIDAZOLE (METROGEL VAGINAL) 0.75 % vaginal gel Place 1 Applicatorful vaginally at bedtime for 5 days. 10/15/20 10/20/20 Yes Julies Carmickle C, PA-C  cetirizine (ZYRTEC) 10 MG tablet Take 1 tablet (10 mg total) by mouth daily. 10/17/18   Eustace Moore, MD  levonorgestrel-ethinyl estradiol (SEASONALE) 0.15-0.03 MG tablet Take 1 tablet by mouth daily. 08/24/20   [provider]  lisdexamfetamine (VYVANSE) 30 MG capsule Take 30 mg by mouth every morning.    [provider]  Olopatadine HCl 0.2 % SOLN Apply 1 drop to eye daily. 10/17/18   Eustace Moore, MD    Family History Family History  Problem Relation Age of Onset   Healthy Mother    Healthy Father     Social History Social History   Tobacco Use   Smoking status: Never   Smokeless tobacco: Never  Substance Use Topics   Alcohol use: Never   Drug use: Never     Allergies   Patient has no known allergies.   Review of Systems Review of Systems  Constitutional:  Negative for fever.  Respiratory:   Negative for shortness of breath.   Cardiovascular:  Negative for chest pain.  Gastrointestinal:  Negative for abdominal pain, diarrhea, nausea and vomiting.  Genitourinary:  Positive for vaginal discharge. Negative for dysuria, flank pain, genital sores, hematuria, menstrual problem, vaginal bleeding and vaginal pain.  Musculoskeletal:  Negative for back pain.  Skin:  Negative for rash.  Neurological:  Negative for dizziness, light-headedness and headaches.    Physical Exam Triage Vital Signs ED Triage Vitals  Enc Vitals Group     BP      Pulse      Resp      Temp      Temp src      SpO2      Weight      Height      Head Circumference      Peak Flow      Pain Score      Pain Loc      Pain Edu?      Excl. in GC?    No data found.  Updated Vital Signs BP 110/71   Pulse 93   Temp 98 F (36.7 C) (Oral)   Resp 18   SpO2 100%   Visual Acuity Right Eye Distance:   Left Eye Distance:   Bilateral Distance:    Right Eye Near:   Left Eye Near:    Bilateral Near:  Physical Exam Vitals and nursing note reviewed.  Constitutional:      Appearance: She is well-developed.     Comments: No acute distress  HENT:     Head: Normocephalic and atraumatic.     Nose: Nose normal.  Eyes:     Conjunctiva/sclera: Conjunctivae normal.  Cardiovascular:     Rate and Rhythm: Normal rate.  Pulmonary:     Effort: Pulmonary effort is normal. No respiratory distress.  Abdominal:     General: There is no distension.  Musculoskeletal:        General: Normal range of motion.     Cervical back: Neck supple.  Skin:    General: Skin is warm and dry.  Neurological:     Mental Status: She is alert and oriented to person, place, and time.     UC Treatments / Results  Labs (all labs ordered are listed, but only abnormal results are displayed) Labs Reviewed  HIV ANTIBODY (ROUTINE TESTING W REFLEX)  RPR  POCT URINE PREGNANCY  CERVICOVAGINAL ANCILLARY ONLY     EKG   Radiology No results found.  Procedures Procedures (including critical Mann time)  Medications Ordered in UC Medications - No data to display  Initial Impression / Assessment and Plan / UC Course  I have reviewed the triage vital signs and the nursing notes.  Pertinent labs & imaging results that were available during my Mann of the patient were reviewed by me and considered in my medical decision making (see chart for details).     Vaginitis-empirically treating for BV with MetroGel, swab pending as well as blood work for HIV and syphilis to further screen for STDs.  Discussed strict return precautions. Patient verbalized understanding and is agreeable with plan.  Final Clinical Impressions(s) / UC Diagnoses   Final diagnoses:  Vaginitis and vulvovaginitis  Screen for STD (sexually transmitted disease)     Discharge Instructions      MetroGel at bedtime x5 days for BV  We are testing you for HIV and Syphillis, Gonorrhea, Chlamydia, Trichomonas, Yeast and Bacterial Vaginosis. We will call you if anything is positive and let you know if you require any further treatment. Please inform partners of any positive results.   Please return if symptoms not improving with treatment, development of fever, nausea, vomiting, abdominal pain.      ED Prescriptions     Medication Sig Dispense Auth. Provider   metroNIDAZOLE (METROGEL VAGINAL) 0.75 % vaginal gel Place 1 Applicatorful vaginally at bedtime for 5 days. 70 g Ardie Dragoo, [place] C, PA-C      PDMP not reviewed this encounter.   Lew Dawes, [place] 10/15/20 [phone]

## 2020-10-15 NOTE — ED Triage Notes (Signed)
Pt sts vaginal discharge that she thinks could be BV with hx of similar

## 2020-10-16 LAB — CERVICOVAGINAL ANCILLARY ONLY
Bacterial Vaginitis (gardnerella): POSITIVE — AB
Candida Glabrata: NEGATIVE
Candida Vaginitis: NEGATIVE
Chlamydia: NEGATIVE
Comment: NEGATIVE
Comment: NEGATIVE
Comment: NEGATIVE
Comment: NEGATIVE
Comment: NEGATIVE
Comment: NORMAL
Neisseria Gonorrhea: NEGATIVE
Trichomonas: NEGATIVE

## 2020-10-16 LAB — HIV ANTIBODY (ROUTINE TESTING W REFLEX): HIV Screen 4th Generation wRfx: NONREACTIVE

## 2020-10-16 LAB — RPR: RPR Ser Ql: NONREACTIVE

## 2020-12-20 ENCOUNTER — Ambulatory Visit (HOSPITAL_COMMUNITY)
Admission: EM | Admit: 2020-12-20 | Discharge: 2020-12-20 | Disposition: A | Discharge status: Home / Self Care | Payer: Medicaid Other | Attending: Emergency Medicine | Admitting: Emergency Medicine

## 2020-12-20 ENCOUNTER — Encounter (HOSPITAL_COMMUNITY): Payer: Self-pay

## 2020-12-20 ENCOUNTER — Other Ambulatory Visit: Payer: Self-pay

## 2020-12-20 DIAGNOSIS — Z113 Encounter for screening for infections with a predominantly sexual mode of transmission: Secondary | ICD-10-CM | POA: Insufficient documentation

## 2020-12-20 LAB — HIV ANTIBODY (ROUTINE TESTING W REFLEX): HIV Screen 4th Generation wRfx: NONREACTIVE

## 2020-12-20 NOTE — ED Triage Notes (Signed)
Pt presents for STD testing, denies SXS.

## 2020-12-20 NOTE — Discharge Instructions (Addendum)
We will contact you if the results from your lab work are positive and require additional treatment.    Do not have sex while taking undergoing treatment for STI.  Make sure that all of your partners get tested and treated.   Use a condom or other barrier method for all sexual encounters.    Return or go to the Emergency Department if symptoms worsen or do not improve in the next few days.  

## 2020-12-20 NOTE — ED Provider Notes (Signed)
[facility]    CSN: [id number] Arrival date & time: 12/20/20  1758      History   Chief Complaint Chief Complaint  Patient presents with   SEXUALLY TRANSMITTED DISEASE    HPI Alice Mann is a 25 y.o. female.   Patient here requesting STD testing.  Denies any symptoms or any contact with STI positive partners.  Denies any trauma, injury, or other precipitating event.  Denies any specific alleviating or aggravating factors.  Denies any fevers, chest pain, shortness of breath, N/V/D, numbness, tingling, weakness, abdominal pain, or headaches.    The history is provided by the patient.   Past Medical History:  Diagnosis Date   ADHD    Heart murmur     There are no problems to display for this patient.   History reviewed. No pertinent surgical history.  OB History   No obstetric history on file.      Home Medications    Prior to Admission medications   Medication Sig Start Date End Date Taking? Authorizing Provider  cetirizine (ZYRTEC) 10 MG tablet Take 1 tablet (10 mg total) by mouth daily. 10/17/18   Eustace Moore, MD  levonorgestrel-ethinyl estradiol (SEASONALE) 0.15-0.03 MG tablet Take 1 tablet by mouth daily. 08/24/20   [provider]  lisdexamfetamine (VYVANSE) 30 MG capsule Take 30 mg by mouth every morning.    [provider]  Olopatadine HCl 0.2 % SOLN Apply 1 drop to eye daily. 10/17/18   Eustace Moore, MD    Family History Family History  Problem Relation Age of Onset   Healthy Mother    Healthy Father     Social History Social History   Tobacco Use   Smoking status: Never   Smokeless tobacco: Never  Substance Use Topics   Alcohol use: Never   Drug use: Never     Allergies   Patient has no known allergies.   Review of Systems Review of Systems  All other systems reviewed and are negative.   Physical Exam Triage Vital Signs ED Triage Vitals  Enc Vitals Group     BP 12/20/20 1829 100/66      Pulse Rate 12/20/20 1829 66     Resp 12/20/20 1829 20     Temp 12/20/20 1829 98.9 F (37.2 C)     Temp Source 12/20/20 1829 Oral     SpO2 12/20/20 1829 98 %     Weight --      Height --      Head Circumference --      Peak Flow --      Pain Score 12/20/20 1828 0     Pain Loc --      Pain Edu? --      Excl. in [place]? --    No data found.  Updated Vital Signs BP 100/66 (BP Location: Right Arm)   Pulse 66   Temp 98.9 F (37.2 C) (Oral)   Resp 20   LMP 11/21/2020 (Exact Date)   SpO2 98%   Visual Acuity Right Eye Distance:   Left Eye Distance:   Bilateral Distance:    Right Eye Near:   Left Eye Near:    Bilateral Near:     Physical Exam Vitals and nursing note reviewed.  Constitutional:      General: She is not in acute distress.    Appearance: Normal appearance. She is not ill-appearing, toxic-appearing or diaphoretic.  HENT:     Head: Normocephalic and atraumatic.  Eyes:     Conjunctiva/sclera: Conjunctivae normal.  Cardiovascular:     Rate and Rhythm: Normal rate.     Pulses: Normal pulses.  Pulmonary:     Effort: Pulmonary effort is normal.  Abdominal:     General: Abdomen is flat.  Genitourinary:    Comments: declines Musculoskeletal:        General: Normal range of motion.     Cervical back: Normal range of motion.  Skin:    General: Skin is warm and dry.  Neurological:     General: No focal deficit present.     Mental Status: She is alert and oriented to person, place, and time.  Psychiatric:        Mood and Affect: Mood normal.     UC Treatments / Results  Labs (all labs ordered are listed, but only abnormal results are displayed) Labs Reviewed  RPR  HIV ANTIBODY (ROUTINE TESTING W REFLEX)  CERVICOVAGINAL ANCILLARY ONLY    EKG   Radiology No results found.  Procedures Procedures (including critical care time)  Medications Ordered in UC Medications - No data to display  Initial Impression / Assessment and Plan / UC Course  I have  reviewed the triage vital signs and the nursing notes.  Pertinent labs & imaging results that were available during my care of the patient were reviewed by me and considered in my medical decision making (see chart for details).    Assessment negative for red flags or concerns.  Self swab obtained and will treat based on results.  HIV and RPR pending.  Discussed safe sex practices including condom or other barrier method use.  Follow-up as needed Final Clinical Impressions(s) / UC Diagnoses   Final diagnoses:  Screen for STD (sexually transmitted disease)     Discharge Instructions      We will contact you if the results from your lab work are positive and require additional treatment.    Do not have sex while taking undergoing treatment for STI.  Make sure that all of your partners get tested and treated.   Use a condom or other barrier method for all sexual encounters.    Return or go to the Emergency Department if symptoms worsen or do not improve in the next few days.      ED Prescriptions   None    PDMP not reviewed this encounter.   Ivette Loyal, NP 12/20/20 [phone]

## 2020-12-21 LAB — CERVICOVAGINAL ANCILLARY ONLY
Bacterial Vaginitis (gardnerella): NEGATIVE
Candida Glabrata: NEGATIVE
Candida Vaginitis: NEGATIVE
Chlamydia: NEGATIVE
Comment: NEGATIVE
Comment: NEGATIVE
Comment: NEGATIVE
Comment: NEGATIVE
Comment: NEGATIVE
Comment: NORMAL
Neisseria Gonorrhea: NEGATIVE
Trichomonas: NEGATIVE

## 2020-12-21 LAB — RPR: RPR Ser Ql: NONREACTIVE

## 2021-02-26 ENCOUNTER — Encounter (HOSPITAL_COMMUNITY): Payer: Self-pay | Admitting: Emergency Medicine

## 2021-02-26 ENCOUNTER — Emergency Department (HOSPITAL_COMMUNITY)
Admission: EM | Admit: 2021-02-26 | Discharge: 2021-02-26 | Disposition: A | Discharge status: Home / Self Care | Payer: Medicaid Other | Attending: Emergency Medicine | Admitting: Emergency Medicine

## 2021-02-26 DIAGNOSIS — Z5321 Procedure and treatment not carried out due to patient leaving prior to being seen by health care provider: Secondary | ICD-10-CM | POA: Insufficient documentation

## 2021-02-26 DIAGNOSIS — N898 Other specified noninflammatory disorders of vagina: Secondary | ICD-10-CM | POA: Insufficient documentation

## 2021-02-26 NOTE — ED Notes (Signed)
I called patient for vital sign recheck and no one responded 

## 2021-02-26 NOTE — ED Triage Notes (Addendum)
Per pt, states she thinks she has BV-noticed symptoms this am-states she needs a "whole STD check up"-vaginal itching and discharge

## 2021-04-23 ENCOUNTER — Ambulatory Visit (HOSPITAL_COMMUNITY)
Admission: EM | Admit: 2021-04-23 | Discharge: 2021-04-23 | Disposition: A | Discharge status: Home / Self Care | Payer: Medicaid Other | Attending: Physician Assistant | Admitting: Physician Assistant

## 2021-04-23 ENCOUNTER — Other Ambulatory Visit: Payer: Self-pay

## 2021-04-23 ENCOUNTER — Encounter (HOSPITAL_COMMUNITY): Payer: Self-pay | Admitting: Emergency Medicine

## 2021-04-23 DIAGNOSIS — N898 Other specified noninflammatory disorders of vagina: Secondary | ICD-10-CM | POA: Insufficient documentation

## 2021-04-23 LAB — HEPATITIS C ANTIBODY: HCV Ab: NONREACTIVE

## 2021-04-23 LAB — HIV ANTIBODY (ROUTINE TESTING W REFLEX): HIV Screen 4th Generation wRfx: NONREACTIVE

## 2021-04-23 MED ORDER — TERCONAZOLE 0.8 % VA CREA: 1.0000 | TOPICAL_CREAM | Freq: Every day | VAGINAL | 0 refills | Status: AC
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
## 2021-04-23 NOTE — ED Triage Notes (Signed)
Pt presents with vaginal itching that started yesterday.

## 2021-04-23 NOTE — Discharge Instructions (Signed)
Use vaginal cream at night for 3 days.  We will contact you if any of your testing is positive will need to arrange treatment.  Follow your MyChart for these results.  Abstain from sex until you receive your results if you require treatment please wait 7 days from completing treatment for additional sexual activities.  Use loosefitting cotton underwear and hypoallergenic soaps and detergents for additional symptom relief.  If you develop any abdominal pain, fever, pelvic pain, increased discharge, nausea, vomiting you need to be seen immediately.

## 2021-04-24 LAB — CERVICOVAGINAL ANCILLARY ONLY
Bacterial Vaginitis (gardnerella): POSITIVE — AB
Candida Glabrata: NEGATIVE
Candida Vaginitis: NEGATIVE
Chlamydia: NEGATIVE
Comment: NEGATIVE
Comment: NEGATIVE
Comment: NEGATIVE
Comment: NEGATIVE
Comment: NEGATIVE
Comment: NORMAL
Neisseria Gonorrhea: NEGATIVE
Trichomonas: NEGATIVE

## 2021-04-24 LAB — RPR: RPR Ser Ql: NONREACTIVE

## 2021-04-25 ENCOUNTER — Telehealth (HOSPITAL_COMMUNITY): Payer: Self-pay | Admitting: Internal Medicine

## 2021-04-25 MED ORDER — METRONIDAZOLE 500 MG PO TABS: 500.0000 mg | ORAL_TABLET | Freq: Two times a day (BID) | ORAL | 0 refills | Status: DC

## 2021-04-25 NOTE — Telephone Encounter (Signed)
Medications sent to [facility] pharmacy

## 2021-04-29 ENCOUNTER — Telehealth (HOSPITAL_COMMUNITY): Payer: Self-pay

## 2021-04-29 MED ORDER — METRONIDAZOLE 0.75 % VA GEL: 1.0000 | Freq: Two times a day (BID) | VAGINAL | 0 refills | Status: DC

## 2021-06-12 ENCOUNTER — Encounter (HOSPITAL_COMMUNITY): Payer: Self-pay | Admitting: Emergency Medicine

## 2021-06-12 ENCOUNTER — Ambulatory Visit (INDEPENDENT_AMBULATORY_CARE_PROVIDER_SITE_OTHER): Payer: Medicaid Other

## 2021-06-12 ENCOUNTER — Ambulatory Visit (HOSPITAL_COMMUNITY)
Admission: EM | Admit: 2021-06-12 | Discharge: 2021-06-12 | Disposition: A | Discharge status: Home / Self Care | Payer: Medicaid Other | Attending: Physician Assistant | Admitting: Physician Assistant

## 2021-06-12 ENCOUNTER — Other Ambulatory Visit: Payer: Self-pay

## 2021-06-12 DIAGNOSIS — S59901A Unspecified injury of right elbow, initial encounter: Secondary | ICD-10-CM

## 2021-06-12 DIAGNOSIS — M25521 Pain in right elbow: Secondary | ICD-10-CM

## 2021-06-12 DIAGNOSIS — Z3202 Encounter for pregnancy test, result negative: Secondary | ICD-10-CM

## 2021-06-12 LAB — POC URINE PREG, ED: Preg Test, Ur: NEGATIVE

## 2021-06-12 MED ORDER — NAPROXEN 500 MG PO TABS: 500.0000 mg | ORAL_TABLET | Freq: Two times a day (BID) | ORAL | 0 refills | Status: AC
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
## 2021-06-12 NOTE — Discharge Instructions (Signed)
Your x-ray was normal.  Start Naprosyn twice daily for pain.  You should not take NSAIDs including aspirin, ibuprofen/Advil, naproxen/Aleve with this medication as it can cause stomach bleeding.  You can use ice and Tylenol for additional symptom relief.  Avoid strenuous and repetitive activity until symptoms improve.  If symptoms or not improving quickly please follow-up with sports medicine; call them to schedule an appointment.  If you have any severe pain, swelling, numbness or tingling, redness, weakness you should be seen immediately.

## 2021-07-05 ENCOUNTER — Encounter (HOSPITAL_COMMUNITY): Payer: Self-pay | Admitting: Emergency Medicine

## 2021-07-05 ENCOUNTER — Other Ambulatory Visit: Payer: Self-pay

## 2021-07-05 ENCOUNTER — Ambulatory Visit (HOSPITAL_COMMUNITY)
Admission: EM | Admit: 2021-07-05 | Discharge: 2021-07-05 | Disposition: A | Discharge status: Home / Self Care | Payer: Medicaid Other | Attending: Nurse Practitioner | Admitting: Nurse Practitioner

## 2021-07-05 DIAGNOSIS — Z113 Encounter for screening for infections with a predominantly sexual mode of transmission: Secondary | ICD-10-CM | POA: Insufficient documentation

## 2021-07-05 DIAGNOSIS — N898 Other specified noninflammatory disorders of vagina: Secondary | ICD-10-CM | POA: Insufficient documentation

## 2021-07-05 LAB — HIV ANTIBODY (ROUTINE TESTING W REFLEX): HIV Screen 4th Generation wRfx: NONREACTIVE

## 2021-07-05 NOTE — ED Provider Notes (Signed)
?[facility] ? ? ? ?CSN: [id number] ?Arrival date & time: 07/05/21  1749 ? ? ?  ? ?History   ?Chief Complaint ?Chief Complaint  ?Patient presents with  ? Vaginal Discharge  ? ? ?HPI ?Alice Mann is a 26 y.o. female.  ? ?Patient presents for white, thin vaginal discharge.  She denies vaginal itching, burning, odor.  She denies any sores, rashes, open lesions on her genitalia.  She is sexually active and LMP was ~3 weeks ago.  The is no dysuria. ? ? ?Past Medical History:  ?Diagnosis Date  ? ADHD   ? Heart murmur   ? ? ?There are no problems to display for this patient. ? ? ?History reviewed. No pertinent surgical history. ? ?OB History   ?No obstetric history on file. ?  ? ? ? ?Home Medications   ? ?Prior to Admission medications   ?Medication Sig Start Date End Date Taking? Authorizing Provider  ?cetirizine (ZYRTEC) 10 MG tablet Take 1 tablet (10 mg total) by mouth daily. 10/17/18   Eustace Moore, MD  ?levonorgestrel-ethinyl estradiol (SEASONALE) 0.15-0.03 MG tablet Take 1 tablet by mouth daily. 08/24/20   [provider]  ?lisdexamfetamine (VYVANSE) 30 MG capsule Take 30 mg by mouth every morning.    [provider]  ?metroNIDAZOLE (FLAGYL) 500 MG tablet Take 1 tablet (500 mg total) by mouth 2 (two) times daily. 04/25/21   Merrilee Jansky, MD  ?metroNIDAZOLE (METROGEL VAGINAL) 0.75 % vaginal gel Place 1 Applicatorful vaginally 2 (two) times daily. 04/29/21   Merrilee Jansky, MD  ?Olopatadine HCl 0.2 % SOLN Apply 1 drop to eye daily. 10/17/18   Eustace Moore, MD  ? ? ?Family History ?Family History  ?Problem Relation Age of Onset  ? Healthy Mother   ? Healthy Father   ? ? ?Social History ?Social History  ? ?Tobacco Use  ? Smoking status: Never  ? Smokeless tobacco: Never  ?Substance Use Topics  ? Alcohol use: Never  ? Drug use: Never  ? ? ? ?Allergies   ?Patient has no known allergies. ? ? ?Review of Systems ?Review of Systems ?Per HPI ? ?Physical Exam ?Triage Vital Signs ?ED  Triage Vitals  ?Enc Vitals Group  ?   BP 07/05/21 1849 101/67  ?   Pulse Rate 07/05/21 1849 69  ?   Resp 07/05/21 1849 16  ?   Temp 07/05/21 1849 98.5 ?F (36.9 ?C)  ?   Temp src --   ?   SpO2 07/05/21 1849 99 %  ?   Weight --   ?   Height --   ?   Head Circumference --   ?   Peak Flow --   ?   Pain Score 07/05/21 1848 0  ?   Pain Loc --   ?   Pain Edu? --   ?   Excl. in [place]? --   ? ?No data found. ? ?Updated Vital Signs ?BP 101/67   Pulse 69   Temp 98.5 ?F (36.9 ?C)   Resp 16   LMP 06/13/2021   SpO2 99%  ? ?Visual Acuity ?Right Eye Distance:   ?Left Eye Distance:   ?Bilateral Distance:   ? ?Right Eye Near:   ?Left Eye Near:    ?Bilateral Near:    ? ?Physical Exam ?Vitals and nursing note reviewed.  ?Constitutional:   ?   General: She is not in acute distress. ?   Appearance: Normal appearance. She is not toxic-appearing.  ?  Skin: ?   General: Skin is warm and dry.  ?   Coloration: Skin is not jaundiced or pale.  ?   Findings: No erythema.  ?Neurological:  ?   Mental Status: She is alert and oriented to person, place, and time.  ?   Motor: No weakness.  ?Psychiatric:     ?   Mood and Affect: Mood normal.     ?   Behavior: Behavior normal.     ?   Thought Content: Thought content normal.     ?   Judgment: Judgment normal.  ? ? ? ?UC Treatments / Results  ?Labs ?(all labs ordered are listed, but only abnormal results are displayed) ?Labs Reviewed  ?RPR  ?HIV ANTIBODY (ROUTINE TESTING W REFLEX)  ?CERVICOVAGINAL ANCILLARY ONLY  ? ? ?EKG ? ? ?Radiology ?No results found. ? ?Procedures ?Procedures (including critical care time) ? ?Medications Ordered in UC ?Medications - No data to display ? ?Initial Impression / Assessment and Plan / UC Course  ?I have reviewed the triage vital signs and the nursing notes. ? ?Pertinent labs & imaging results that were available during my care of the patient were reviewed by me and considered in my medical decision making (see chart for details). ? ?  ?Screen for STI today; will notify  with positive results and send in appropriate treatment.   ?Final Clinical Impressions(s) / UC Diagnoses  ? ?Final diagnoses:  ?Vaginal discharge  ?Routine screening for STI (sexually transmitted infection)  ? ? ? ?Discharge Instructions   ? ?  ?We will let you know with any positive results and will send in treatment if anything is positive.  Please wear condoms with every sexual encounter.  ? ? ? ? ?ED Prescriptions   ?None ?  ? ?PDMP not reviewed this encounter. ?  ?Valentino Nose, NP ?07/05/21 1925 ? ?

## 2021-07-05 NOTE — Discharge Instructions (Addendum)
We will let you know with any positive results and will send in treatment if anything is positive.  Please wear condoms with every sexual encounter.  ?

## 2021-07-05 NOTE — ED Triage Notes (Signed)
Vaginal discharge for 2-3 days.  ?

## 2021-07-06 LAB — RPR: RPR Ser Ql: NONREACTIVE

## 2021-07-08 ENCOUNTER — Telehealth (HOSPITAL_COMMUNITY): Payer: Self-pay | Admitting: Emergency Medicine

## 2021-07-08 LAB — CERVICOVAGINAL ANCILLARY ONLY
Bacterial Vaginitis (gardnerella): NEGATIVE
Candida Glabrata: NEGATIVE
Candida Vaginitis: POSITIVE — AB
Chlamydia: NEGATIVE
Comment: NEGATIVE
Comment: NEGATIVE
Comment: NEGATIVE
Comment: NEGATIVE
Comment: NEGATIVE
Comment: NORMAL
Neisseria Gonorrhea: NEGATIVE
Trichomonas: NEGATIVE

## 2021-07-08 MED ORDER — FLUCONAZOLE 150 MG PO TABS: 150.0000 mg | ORAL_TABLET | Freq: Once | ORAL | 0 refills | Status: AC

## 2021-08-11 ENCOUNTER — Encounter (HOSPITAL_COMMUNITY): Payer: Self-pay

## 2021-08-11 ENCOUNTER — Ambulatory Visit (HOSPITAL_COMMUNITY)
Admission: EM | Admit: 2021-08-11 | Discharge: 2021-08-11 | Disposition: A | Discharge status: Home / Self Care | Payer: Medicaid Other | Attending: Nurse Practitioner | Admitting: Nurse Practitioner

## 2021-08-11 DIAGNOSIS — N760 Acute vaginitis: Secondary | ICD-10-CM | POA: Diagnosis not present

## 2021-08-11 HISTORY — DX: Other specified bacterial agents as the cause of diseases classified elsewhere: B96.89

## 2021-08-11 LAB — POCT URINALYSIS DIPSTICK, ED / UC
Bilirubin Urine: NEGATIVE
Glucose, UA: NEGATIVE mg/dL
Hgb urine dipstick: NEGATIVE
Ketones, ur: NEGATIVE mg/dL
Leukocytes,Ua: NEGATIVE
Nitrite: NEGATIVE
Protein, ur: NEGATIVE mg/dL
Specific Gravity, Urine: 1.025 (ref 1.005–1.030)
Urobilinogen, UA: 1 mg/dL (ref 0.0–1.0)
pH: 6 (ref 5.0–8.0)

## 2021-08-11 LAB — POC URINE PREG, ED: Preg Test, Ur: NEGATIVE

## 2021-08-11 LAB — HIV ANTIBODY (ROUTINE TESTING W REFLEX): HIV Screen 4th Generation wRfx: NONREACTIVE

## 2021-08-11 MED ORDER — METRONIDAZOLE 0.75 % VA GEL: 1.0000 | Freq: Every day | VAGINAL | 0 refills | Status: AC

## 2021-08-11 NOTE — ED Provider Notes (Signed)
?[place] ? ? ? ?CSN: [id number] ?Arrival date & time: 08/11/21  1000 ? ? ?  ? ?History   ?Chief Complaint ?Chief Complaint  ?Patient presents with  ? Vaginal Discharge  ? ? ?HPI ?Sima Gennette is a 26 y.o. female.  ? ?The patient is a 26 year old female who presents with vaginal odor and vaginal discharge.  She states her symptoms started 1 day ago.  She describes the odor as "fishy".  She states the discharge is "whitish-yellow".  She denies vaginal itching, abdominal pain, urinary frequency, urgency, or hesitancy.  She reports she has had sex with 2 female partners in the past 64 days with 0% condom use.  She currently is not on any birth control.  LMP: 08/05/21. She would also like HIV and syphilis testing today. ? ?The history is provided by the patient.  ? ?Past Medical History:  ?Diagnosis Date  ? ADHD   ? Bacterial vaginosis   ? Heart murmur   ? ? ?There are no problems to display for this patient. ? ? ?History reviewed. No pertinent surgical history. ? ?OB History   ?No obstetric history on file. ?  ? ? ? ?Home Medications   ? ?Prior to Admission medications   ?Medication Sig Start Date End Date Taking? Authorizing Provider  ?metroNIDAZOLE (METROGEL) 0.75 % vaginal gel Place 1 Applicatorful vaginally at bedtime for 5 days. 08/11/21 08/16/21 Yes Therese Rocco-Warren, Alda Lea, NP  ?cetirizine (ZYRTEC) 10 MG tablet Take 1 tablet (10 mg total) by mouth daily. 10/17/18   Raylene Everts, MD  ?levonorgestrel-ethinyl estradiol (SEASONALE) 0.15-0.03 MG tablet Take 1 tablet by mouth daily. 08/24/20   [provider]  ?lisdexamfetamine (VYVANSE) 30 MG capsule Take 30 mg by mouth every morning.    [provider]  ?Olopatadine HCl 0.2 % SOLN Apply 1 drop to eye daily. 10/17/18   Raylene Everts, MD  ? ? ?Family History ?Family History  ?Problem Relation Age of Onset  ? Healthy Mother   ? Healthy Father   ? ? ?Social History ?Social History  ? ?Tobacco Use  ? Smoking status: Never  ? Smokeless  tobacco: Never  ?Vaping Use  ? Vaping Use: Never used  ?Substance Use Topics  ? Alcohol use: Never  ? Drug use: Never  ? ? ? ?Allergies   ?Patient has no known allergies. ? ? ?Review of Systems ?Review of Systems  ?Constitutional: Negative.   ?Gastrointestinal: Negative.   ?Genitourinary:  Positive for vaginal discharge. Negative for dysuria, frequency, hematuria, urgency, vaginal bleeding and vaginal pain.  ?Skin: Negative.   ?Psychiatric/Behavioral: Negative.    ? ? ?Physical Exam ?Triage Vital Signs ?ED Triage Vitals  ?Enc Vitals Group  ?   BP 08/11/21 1013 (!) 94/56  ?   Pulse Rate 08/11/21 1013 67  ?   Resp 08/11/21 1013 18  ?   Temp 08/11/21 1013 97.9 ?F (36.6 ?C)  ?   Temp src --   ?   SpO2 08/11/21 1013 97 %  ?   Weight --   ?   Height --   ?   Head Circumference --   ?   Peak Flow --   ?   Pain Score 08/11/21 1016 0  ?   Pain Loc --   ?   Pain Edu? --   ?   Excl. in [place]? --   ? ?No data found. ? ?Updated Vital Signs ?BP (!) 94/56 (BP Location: Left Arm)   Pulse  67   Temp 97.9 ?F (36.6 ?C)   Resp 18   LMP 08/05/2021   SpO2 97%  ? ?Visual Acuity ?Right Eye Distance:   ?Left Eye Distance:   ?Bilateral Distance:   ? ?Right Eye Near:   ?Left Eye Near:    ?Bilateral Near:    ? ?Physical Exam ?Vitals reviewed.  ?Constitutional:   ?   General: She is not in acute distress. ?   Appearance: Normal appearance.  ?HENT:  ?   Head: Normocephalic.  ?Cardiovascular:  ?   Rate and Rhythm: Normal rate and regular rhythm.  ?Pulmonary:  ?   Effort: Pulmonary effort is normal.  ?   Breath sounds: Normal breath sounds.  ?Abdominal:  ?   General: Bowel sounds are normal.  ?   Palpations: Abdomen is soft.  ?Skin: ?   General: Skin is warm and dry.  ?   Capillary Refill: Capillary refill takes less than 2 seconds.  ?Neurological:  ?   General: No focal deficit present.  ?   Mental Status: She is alert and oriented to person, place, and time.  ?Psychiatric:     ?   Mood and Affect: Mood normal.     ?   Behavior: Behavior normal.   ? ? ? ?UC Treatments / Results  ?Labs ?(all labs ordered are listed, but only abnormal results are displayed) ?Labs Reviewed  ?HIV ANTIBODY (ROUTINE TESTING W REFLEX)  ?RPR  ?POCT URINALYSIS DIPSTICK, ED / UC  ?POC URINE PREG, ED  ?CERVICOVAGINAL ANCILLARY ONLY  ? ? ?EKG ? ? ?Radiology ?No results found. ? ?Procedures ?Procedures (including critical care time) ? ?Medications Ordered in UC ?Medications - No data to display ? ?Initial Impression / Assessment and Plan / UC Course  ?I have reviewed the triage vital signs and the nursing notes. ? ?Pertinent labs & imaging results that were available during my care of the patient were reviewed by me and considered in my medical decision making (see chart for details). ? ?The patient is a 26 year old female who presents with vaginal symptoms.  Symptoms started 1 day ago.  Her symptoms include vaginal odor, and vaginal discharge.  Patient describes the odor as "fishy".  Based on her current symptoms, I am going to treat her with MetroGel per the patient's request. After review of her chart, patient has a history of recurrent BV.  Patient advised that her cytology, HIV and syphilis results will be available within the next 48 to 72 hours if not sooner.  Patient also advised that if her results are negative for BV, she will be contacted and asked to stop the medication.  Patient advised to increase condom use, and consider some form of birth control in the near future.  Follow-up as needed. ?Final Clinical Impressions(s) / UC Diagnoses  ? ?Final diagnoses:  ?Acute vaginitis  ? ? ? ?Discharge Instructions   ? ?  ?Your urinalysis and urine pregnancy test are negative today.  Your cytology, HIV and syphilis results will be available within the next 48- 72 hours.  You will be able to see these results if you have access to MyChart. ?Increase condom use. ?Consider some form of birth control. ?Follow-up as needed. ? ? ? ? ?ED Prescriptions   ? ? Medication Sig Dispense Auth.  Provider  ? metroNIDAZOLE (METROGEL) 0.75 % vaginal gel Place 1 Applicatorful vaginally at bedtime for 5 days. 75 g Aayush Gelpi-Warren, Alda Lea, NP  ? ?  ? ?PDMP not reviewed this  encounter. ?  ?Tish Men, NP ?08/11/21 1055 ? ?

## 2021-08-11 NOTE — Discharge Instructions (Addendum)
Your urinalysis and urine pregnancy test are negative today.  Your cytology, HIV and syphilis results will be available within the next 48- 72 hours.  You will be able to see these results if you have access to MyChart. ?Increase condom use. ?Consider some form of birth control. ?Follow-up as needed. ?

## 2021-08-11 NOTE — ED Triage Notes (Signed)
Pt states that she has a hx of BV. Pt states that she began noticing an odor yesterday. Pt states she has a whitish yellow discharge. ?

## 2021-08-12 LAB — CERVICOVAGINAL ANCILLARY ONLY
Bacterial Vaginitis (gardnerella): POSITIVE — AB
Candida Glabrata: NEGATIVE
Candida Vaginitis: NEGATIVE
Chlamydia: NEGATIVE
Comment: NEGATIVE
Comment: NEGATIVE
Comment: NEGATIVE
Comment: NEGATIVE
Comment: NEGATIVE
Comment: NORMAL
Neisseria Gonorrhea: NEGATIVE
Trichomonas: NEGATIVE

## 2021-08-12 LAB — RPR: RPR Ser Ql: NONREACTIVE

## 2021-09-06 ENCOUNTER — Encounter (HOSPITAL_COMMUNITY): Payer: Self-pay

## 2021-09-06 ENCOUNTER — Ambulatory Visit (HOSPITAL_COMMUNITY)
Admission: EM | Admit: 2021-09-06 | Discharge: 2021-09-06 | Disposition: A | Discharge status: Home / Self Care | Payer: Medicaid Other | Attending: Internal Medicine | Admitting: Internal Medicine

## 2021-09-06 DIAGNOSIS — R220 Localized swelling, mass and lump, head: Secondary | ICD-10-CM

## 2021-09-06 MED ORDER — BACITRACIN ZINC 500 UNIT/GM EX OINT: 1.0000 "application " | TOPICAL_OINTMENT | Freq: Two times a day (BID) | CUTANEOUS | 0 refills | Status: AC

## 2021-09-06 NOTE — ED Provider Notes (Signed)
?[place] ? ? ? ?CSN: [id number] ?Arrival date & time: 09/06/21  1424 ? ? ?  ? ?History   ?Chief Complaint ?Chief Complaint  ?Patient presents with  ? Oral Swelling  ? ? ?HPI ?Tuana Mattei is a 26 y.o. female comes to the urgent care with right upper lip swelling after she squeezed this cannulation a couple of days ago.  The skin lesion has been present for several months with no changes in size or color.  Patient did not like the location of the lesion hence she decided to squeeze it..  ? ?HPI ? ?Past Medical History:  ?Diagnosis Date  ? ADHD   ? Bacterial vaginosis   ? Heart murmur   ? ? ?There are no problems to display for this patient. ? ? ?History reviewed. No pertinent surgical history. ? ?OB History   ?No obstetric history on file. ?  ? ? ? ?Home Medications   ? ?Prior to Admission medications   ?Medication Sig Start Date End Date Taking? Authorizing Provider  ?bacitracin ointment Apply 1 application. topically 2 (two) times daily. 09/06/21  Yes Halei Hanover, Myrene Galas, MD  ?cetirizine (ZYRTEC) 10 MG tablet Take 1 tablet (10 mg total) by mouth daily. 10/17/18   Raylene Everts, MD  ?levonorgestrel-ethinyl estradiol (SEASONALE) 0.15-0.03 MG tablet Take 1 tablet by mouth daily. 08/24/20   [provider]  ?lisdexamfetamine (VYVANSE) 30 MG capsule Take 30 mg by mouth every morning.    [provider]  ?Olopatadine HCl 0.2 % SOLN Apply 1 drop to eye daily. 10/17/18   Raylene Everts, MD  ? ? ?Family History ?Family History  ?Problem Relation Age of Onset  ? Healthy Mother   ? Healthy Father   ? ? ?Social History ?Social History  ? ?Tobacco Use  ? Smoking status: Never  ? Smokeless tobacco: Never  ?Vaping Use  ? Vaping Use: Never used  ?Substance Use Topics  ? Alcohol use: Never  ? Drug use: Never  ? ? ? ?Allergies   ?Patient has no known allergies. ? ? ?Review of Systems ?Review of Systems ?As per HPI ? ?Physical Exam ?Triage Vital Signs ?ED Triage Vitals  ?Enc Vitals Group  ?   BP  09/06/21 1455 101/64  ?   Pulse Rate 09/06/21 1455 72  ?   Resp 09/06/21 1455 16  ?   Temp 09/06/21 1455 98.4 ?F (36.9 ?C)  ?   Temp Source 09/06/21 1455 Oral  ?   SpO2 09/06/21 1455 100 %  ?   Weight --   ?   Height --   ?   Head Circumference --   ?   Peak Flow --   ?   Pain Score 09/06/21 1551 5  ?   Pain Loc --   ?   Pain Edu? --   ?   Excl. in [place]? --   ? ?No data found. ? ?Updated Vital Signs ?BP 101/64 (BP Location: Left Arm)   Pulse 72   Temp 98.4 ?F (36.9 ?C) (Oral)   Resp 16   SpO2 100%  ? ?Visual Acuity ?Right Eye Distance:   ?Left Eye Distance:   ?Bilateral Distance:   ? ?Right Eye Near:   ?Left Eye Near:    ?Bilateral Near:    ? ?Physical Exam ?Vitals and nursing note reviewed.  ?Cardiovascular:  ?   Rate and Rhythm: Normal rate and regular rhythm.  ?Musculoskeletal:     ?   General: Normal  range of motion.  ?Skin: ?   Comments: Mild swelling of the upper lip on the right side.  Small papule which has been deroofed.  No surrounding erythema  ? ? ? ?UC Treatments / Results  ?Labs ?(all labs ordered are listed, but only abnormal results are displayed) ?Labs Reviewed - No data to display ? ?EKG ? ? ?Radiology ?No results found. ? ?Procedures ?Procedures (including critical care time) ? ?Medications Ordered in UC ?Medications - No data to display ? ?Initial Impression / Assessment and Plan / UC Course  ?I have reviewed the triage vital signs and the nursing notes. ? ?Pertinent labs & imaging results that were available during my care of the patient were reviewed by me and considered in my medical decision making (see chart for details). ? ?  ? ?1.  Swelling of the upper lip: ?Bacitracin ointment ?Patient is advised to quit squeezing papules associated with hair follicles ?Return precautions given. ?Final Clinical Impressions(s) / UC Diagnoses  ? ?Final diagnoses:  ?Swelling of upper lip  ? ? ? ?Discharge Instructions   ? ?  ?Swelling on the upper lip is a hair follicle.  Please do not squeeze it.  Apply  the topical antibiotic ointment. ?Return to urgent care if you have any worsening swelling or pain. ? ? ?ED Prescriptions   ? ? Medication Sig Dispense Auth. Provider  ? bacitracin ointment Apply 1 application. topically 2 (two) times daily. 120 g Veera Stapleton, Myrene Galas, MD  ? ?  ? ?PDMP not reviewed this encounter. ?  ?Chase Picket, MD ?09/06/21 1619 ? ?

## 2021-09-06 NOTE — Discharge Instructions (Addendum)
Swelling on the upper lip is a hair follicle.  Please do not squeeze it.  Apply the topical antibiotic ointment. ?Return to urgent care if you have any worsening swelling or pain. ?

## 2021-10-10 ENCOUNTER — Encounter (HOSPITAL_COMMUNITY): Payer: Self-pay | Admitting: Emergency Medicine

## 2021-10-10 ENCOUNTER — Ambulatory Visit (HOSPITAL_COMMUNITY)
Admission: EM | Admit: 2021-10-10 | Discharge: 2021-10-10 | Disposition: A | Discharge status: Home / Self Care | Payer: Medicaid Other | Attending: Nurse Practitioner | Admitting: Nurse Practitioner

## 2021-10-10 DIAGNOSIS — L739 Follicular disorder, unspecified: Secondary | ICD-10-CM | POA: Diagnosis present

## 2021-10-10 DIAGNOSIS — N750 Cyst of Bartholin's gland: Secondary | ICD-10-CM | POA: Insufficient documentation

## 2021-10-10 DIAGNOSIS — Z113 Encounter for screening for infections with a predominantly sexual mode of transmission: Secondary | ICD-10-CM | POA: Diagnosis present

## 2021-10-10 LAB — HIV ANTIBODY (ROUTINE TESTING W REFLEX): HIV Screen 4th Generation wRfx: NONREACTIVE

## 2021-10-10 MED ORDER — DOXYCYCLINE HYCLATE 100 MG PO CAPS: 100.0000 mg | ORAL_CAPSULE | Freq: Two times a day (BID) | ORAL | 0 refills | Status: AC

## 2021-10-10 NOTE — ED Provider Notes (Signed)
[facility]    CSN: [id number] Arrival date & time: 10/10/21  1740      History   Chief Complaint Chief Complaint  Patient presents with   Vaginal Pain    HPI Alice Mann is a 26 y.o. female.   HPI She is in today for a 2 days history of pain to her vaginal area. She does shave and did shave with a used razor. She felt the pain on yesterday. She notes that this is on one side. She denies any previous history of a Bartholin cyst . She has noticed a little vaginal discharge. She denies any fever or chills. Denies any pelvic pain or tenderness, amenorrhea irregular bleeding or prolonged heavy bleeding.  Denies or dysuria. Past Medical History:  Diagnosis Date   ADHD    Bacterial vaginosis    Heart murmur     There are no problems to display for this patient.   History reviewed. No pertinent surgical history.  OB History   No obstetric history on file.      Home Medications    Prior to Admission medications   Medication Sig Start Date End Date Taking? Authorizing Provider  doxycycline (VIBRAMYCIN) 100 MG capsule Take 1 capsule (100 mg total) by mouth 2 (two) times daily for 5 days. 10/10/21 10/15/21 Yes Barbette Merino, NP  bacitracin ointment Apply 1 application. topically 2 (two) times daily. 09/06/21   Lamptey, Britta Mccreedy, MD  cetirizine (ZYRTEC) 10 MG tablet Take 1 tablet (10 mg total) by mouth daily. 10/17/18   Eustace Moore, MD  levonorgestrel-ethinyl estradiol (SEASONALE) 0.15-0.03 MG tablet Take 1 tablet by mouth daily. 08/24/20   [provider]  lisdexamfetamine (VYVANSE) 30 MG capsule Take 30 mg by mouth every morning.    [provider]  Olopatadine HCl 0.2 % SOLN Apply 1 drop to eye daily. 10/17/18   Eustace Moore, MD    Family History Family History  Problem Relation Age of Onset   Healthy Mother    Healthy Father     Social History Social History   Tobacco Use   Smoking status: Never   Smokeless tobacco: Never   Vaping Use   Vaping Use: Never used  Substance Use Topics   Alcohol use: Never   Drug use: Never     Allergies   Patient has no known allergies.   Review of Systems Review of Systems  All other systems reviewed and are negative.    Physical Exam Triage Vital Signs ED Triage Vitals  Enc Vitals Group     BP 10/10/21 0853 118/66     Pulse Rate 10/10/21 0853 79     Resp 10/10/21 0853 14     Temp 10/10/21 0853 98 F (36.7 C)     Temp Source 10/10/21 0853 Oral     SpO2 10/10/21 0853 100 %     Weight --      Height --      Head Circumference --      Peak Flow --      Pain Score 10/10/21 0852 5     Pain Loc --      Pain Edu? --      Excl. in [place]? --    No data found.  Updated Vital Signs BP 118/66 (BP Location: Right Arm)   Pulse 79   Temp 98 F (36.7 C) (Oral)   Resp 14   LMP 10/05/2021   SpO2 100%   Visual Acuity  Right Eye Distance:   Left Eye Distance:   Bilateral Distance:    Right Eye Near:   Left Eye Near:    Bilateral Near:     Physical Exam Constitutional:      General: She is not in acute distress.    Appearance: She is normal weight. She is not ill-appearing.  HENT:     Head: Normocephalic and atraumatic.  Neurological:     Mental Status: She is alert.      UC Treatments / Results  Labs (all labs ordered are listed, but only abnormal results are displayed) Labs Reviewed  HIV ANTIBODY (ROUTINE TESTING W REFLEX)  RPR  CERVICOVAGINAL ANCILLARY ONLY    EKG   Radiology No results found.  Procedures Procedures (including critical care time)  Medications Ordered in UC Medications - No data to display  Initial Impression / Assessment and Plan / UC Course  I have reviewed the triage vital signs and the nursing notes.  Pertinent labs & imaging results that were available during my care of the patient were reviewed by me and considered in my medical decision making (see chart for details).    Bartholin Cyst vs Folliculitis   Doxycyline 100 mg BID x5 days due to patient anxiety Education provided   STI testing completed  Final Clinical Impressions(s) / UC Diagnoses   Final diagnoses:  Bartholin's cyst  Folliculitis  Screening for STD (sexually transmitted disease)     Discharge Instructions      [name] This is your after visit summary for today.   Bartholin cyst vs folliculitis warm compress A wait and see Doxycyline 100 mg BID x 5 days if needed for infection   Avoid reusing disposable razor    STI testing completed Nurse to call with results  Please feel free to call our office, if you have any questions. Have a good day. Thad Ranger NP      ED Prescriptions     Medication Sig Dispense Auth. Provider   doxycycline (VIBRAMYCIN) 100 MG capsule Take 1 capsule (100 mg total) by mouth 2 (two) times daily for 5 days. 10 capsule Barbette Merino, NP      PDMP not reviewed this encounter.   Thad Ranger [place], [place] 10/10/21 2154

## 2021-10-10 NOTE — Discharge Instructions (Addendum)
Ms. Kugelman This is your after visit summary for today.   Bartholin cyst vs folliculitis warm compress A wait and see Doxycyline 100 mg BID x 5 days if needed for infection   Avoid reusing disposable razor    STI testing completed Nurse to call with results  Please feel free to call our office, if you have any questions. Have a good day. Thad Ranger NP

## 2021-10-10 NOTE — ED Triage Notes (Signed)
Pt reports that noticed a bump in genital area that is painful.

## 2021-10-11 LAB — CERVICOVAGINAL ANCILLARY ONLY
Bacterial Vaginitis (gardnerella): NEGATIVE
Candida Glabrata: NEGATIVE
Candida Vaginitis: NEGATIVE
Chlamydia: NEGATIVE
Comment: NEGATIVE
Comment: NEGATIVE
Comment: NEGATIVE
Comment: NEGATIVE
Comment: NEGATIVE
Comment: NORMAL
Neisseria Gonorrhea: NEGATIVE
Trichomonas: NEGATIVE

## 2021-10-11 LAB — RPR: RPR Ser Ql: NONREACTIVE

## 2021-11-30 ENCOUNTER — Ambulatory Visit (HOSPITAL_COMMUNITY)
Admission: EM | Admit: 2021-11-30 | Discharge: 2021-11-30 | Disposition: A | Discharge status: Home / Self Care | Payer: Medicaid Other | Attending: Physician Assistant | Admitting: Physician Assistant

## 2021-11-30 ENCOUNTER — Encounter (HOSPITAL_COMMUNITY): Payer: Self-pay | Admitting: Emergency Medicine

## 2021-11-30 DIAGNOSIS — N760 Acute vaginitis: Secondary | ICD-10-CM | POA: Diagnosis not present

## 2021-11-30 DIAGNOSIS — N898 Other specified noninflammatory disorders of vagina: Secondary | ICD-10-CM | POA: Diagnosis not present

## 2021-11-30 DIAGNOSIS — Z113 Encounter for screening for infections with a predominantly sexual mode of transmission: Secondary | ICD-10-CM

## 2021-11-30 LAB — HIV ANTIBODY (ROUTINE TESTING W REFLEX): HIV Screen 4th Generation wRfx: NONREACTIVE

## 2021-11-30 NOTE — ED Triage Notes (Signed)
Pt reports vaginal discharge and mild vaginal odor x 3 days. States she believes it is BV.

## 2021-11-30 NOTE — ED Provider Notes (Signed)
[facility]    CSN: [id number] Arrival date & time: 11/30/21  1135      History   Chief Complaint Chief Complaint  Patient presents with   Vaginal Discharge    HPI Alice Mann is a 26 y.o. female.   26 year old female presents with vaginal discharge.  Patient indicates that her last period was July 8 and normal at that time.  Patient indicates that she had unprotected intercourse 2 days ago.  She indicates since then she has started having vaginal discharge, white, thin, with odor and itching.  Patient indicates that she is not having any urinary symptoms, frequency or dysuria.  Patient relates she does not have any fever or chills.  Patient indicates that she has had bacterial vaginosis before and the symptoms are similar to when she had this last time.  She indicates that she does not like taking the tablet she would rather do the gel.  Patient request to have HIV and RPR testing along with STI testing.   Vaginal Discharge   Past Medical History:  Diagnosis Date   ADHD    Bacterial vaginosis    Heart murmur     There are no problems to display for this patient.   History reviewed. No pertinent surgical history.  OB History   No obstetric history on file.      Home Medications    Prior to Admission medications   Medication Sig Start Date End Date Taking? Authorizing Provider  bacitracin ointment Apply 1 application. topically 2 (two) times daily. 09/06/21   Lamptey, Britta Mccreedy, MD  cetirizine (ZYRTEC) 10 MG tablet Take 1 tablet (10 mg total) by mouth daily. 10/17/18   Eustace Moore, MD  levonorgestrel-ethinyl estradiol (SEASONALE) 0.15-0.03 MG tablet Take 1 tablet by mouth daily. 08/24/20   [provider]  lisdexamfetamine (VYVANSE) 30 MG capsule Take 30 mg by mouth every morning.    [provider]  Olopatadine HCl 0.2 % SOLN Apply 1 drop to eye daily. 10/17/18   Eustace Moore, MD    Family History Family History  Problem  Relation Age of Onset   Healthy Mother    Healthy Father     Social History Social History   Tobacco Use   Smoking status: Never   Smokeless tobacco: Never  Vaping Use   Vaping Use: Never used  Substance Use Topics   Alcohol use: Never   Drug use: Never     Allergies   Patient has no known allergies.   Review of Systems Review of Systems  Genitourinary:  Positive for vaginal discharge (white with odor and itching).     Physical Exam Triage Vital Signs ED Triage Vitals  Enc Vitals Group     BP 11/30/21 1315 111/68     Pulse Rate 11/30/21 1315 77     Resp 11/30/21 1315 16     Temp 11/30/21 1315 98 F (36.7 C)     Temp Source 11/30/21 1315 Oral     SpO2 11/30/21 1315 100 %     Weight --      Height --      Head Circumference --      Peak Flow --      Pain Score 11/30/21 1312 0     Pain Loc --      Pain Edu? --      Excl. in GC? --    No data found.  Updated Vital Signs BP 111/68 (BP Location: Left  Arm)   Pulse 77   Temp 98 F (36.7 C) (Oral)   Resp 16   SpO2 100%   Visual Acuity Right Eye Distance:   Left Eye Distance:   Bilateral Distance:    Right Eye Near:   Left Eye Near:    Bilateral Near:     Physical Exam Constitutional:      Appearance: Normal appearance.  Abdominal:     General: Abdomen is flat. Bowel sounds are normal.     Palpations: Abdomen is soft.     Tenderness: There is no abdominal tenderness. There is no guarding or rebound.  Neurological:     Mental Status: She is alert.      UC Treatments / Results  Labs (all labs ordered are listed, but only abnormal results are displayed) Labs Reviewed  RPR  HIV ANTIBODY (ROUTINE TESTING W REFLEX)  CERVICOVAGINAL ANCILLARY ONLY    EKG   Radiology No results found.  Procedures Procedures (including critical care time)  Medications Ordered in UC Medications - No data to display  Initial Impression / Assessment and Plan / UC Course  I have reviewed the triage vital  signs and the nursing notes.  Pertinent labs & imaging results that were available during my care of the patient were reviewed by me and considered in my medical decision making (see chart for details).    Plan: 1.  STI testing to include RPR and HIV are pending. 2.  Advised to follow-up with PCP or return to urgent care if symptoms fail to improve. Final Clinical Impressions(s) / UC Diagnoses   Final diagnoses:  Vaginal discharge  Acute vaginitis  Routine screening for STI (sexually transmitted infection)     Discharge Instructions      Lab testing for STI to include HIV and RPR will take 48 hours to complete.  If lab results are normal or negative then you will not hear from our office.  You can go on MyChart and check the results when they post in 24 to 48 hours. If any of the lab results are positive then this office will call with results and suggested treatment.     ED Prescriptions   None    PDMP not reviewed this encounter.   Ellsworth Lennox, PA-C 11/30/21 1502

## 2021-11-30 NOTE — Discharge Instructions (Signed)
Lab testing for STI to include HIV and RPR will take 48 hours to complete.  If lab results are normal or negative then you will not hear from our office.  You can go on MyChart and check the results when they post in 24 to 48 hours. If any of the lab results are positive then this office will call with results and suggested treatment.

## 2021-12-01 LAB — RPR: RPR Ser Ql: NONREACTIVE

## 2021-12-02 LAB — CERVICOVAGINAL ANCILLARY ONLY
Bacterial Vaginitis (gardnerella): POSITIVE — AB
Candida Glabrata: NEGATIVE
Candida Vaginitis: NEGATIVE
Chlamydia: NEGATIVE
Comment: NEGATIVE
Comment: NEGATIVE
Comment: NEGATIVE
Comment: NEGATIVE
Comment: NEGATIVE
Comment: NORMAL
Neisseria Gonorrhea: NEGATIVE
Trichomonas: NEGATIVE

## 2021-12-03 ENCOUNTER — Telehealth (HOSPITAL_COMMUNITY): Payer: Self-pay | Admitting: Emergency Medicine

## 2021-12-03 MED ORDER — METRONIDAZOLE 0.75 % VA GEL: 1.0000 | Freq: Every day | VAGINAL | 0 refills | Status: AC

## 2023-07-17 IMAGING — DX DG ELBOW COMPLETE 3+V*R*
4 series · 4 of 4 positions shown · non-contrast
Comparison: None.

CLINICAL DATA: Posterior elbow pain after twisting injury this
morning.

EXAM:
RIGHT ELBOW - COMPLETE 3+ VIEW

[elbow ap]
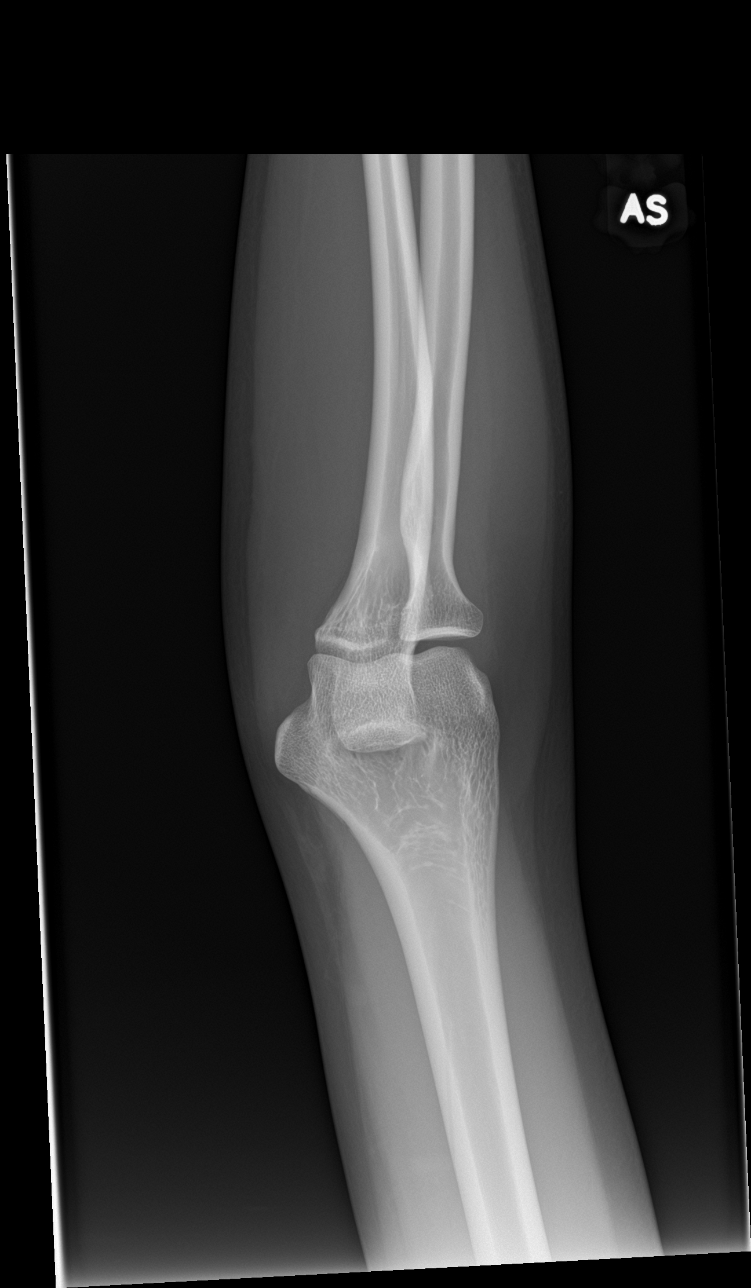

[elbow obl (1 of 2)]
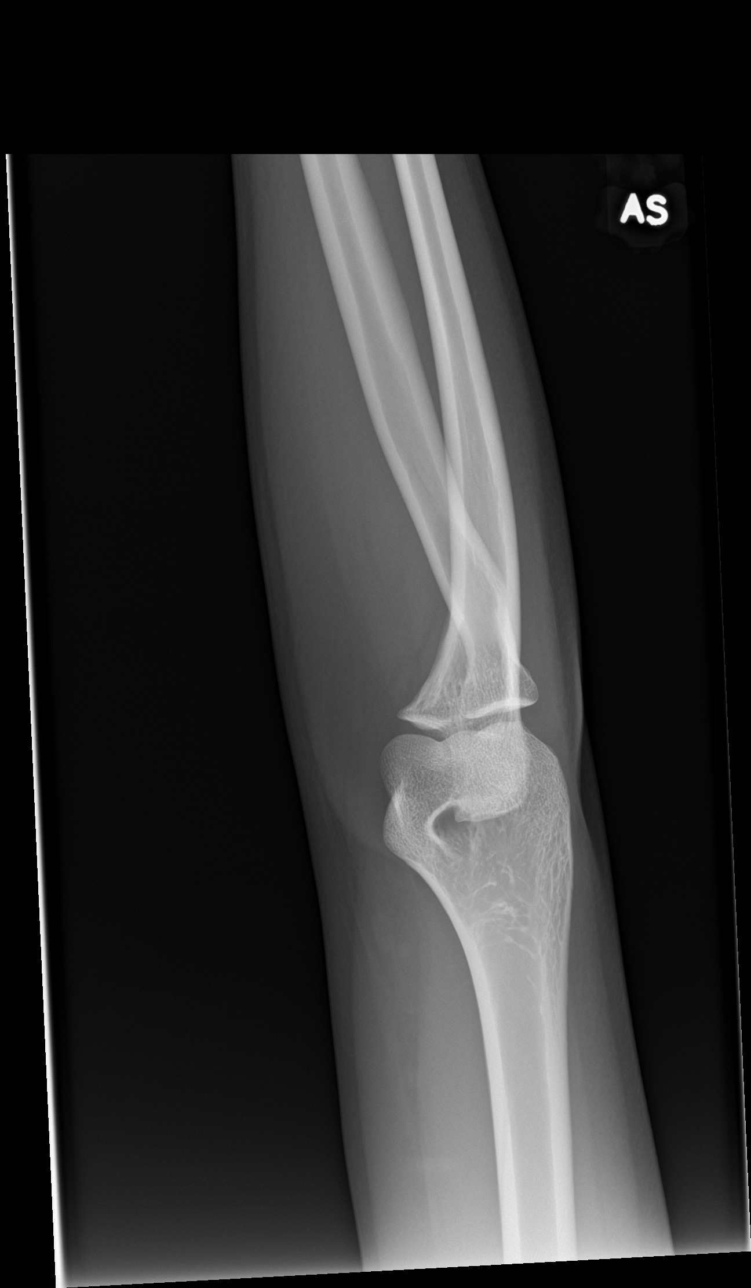

[elbow obl (2 of 2)]
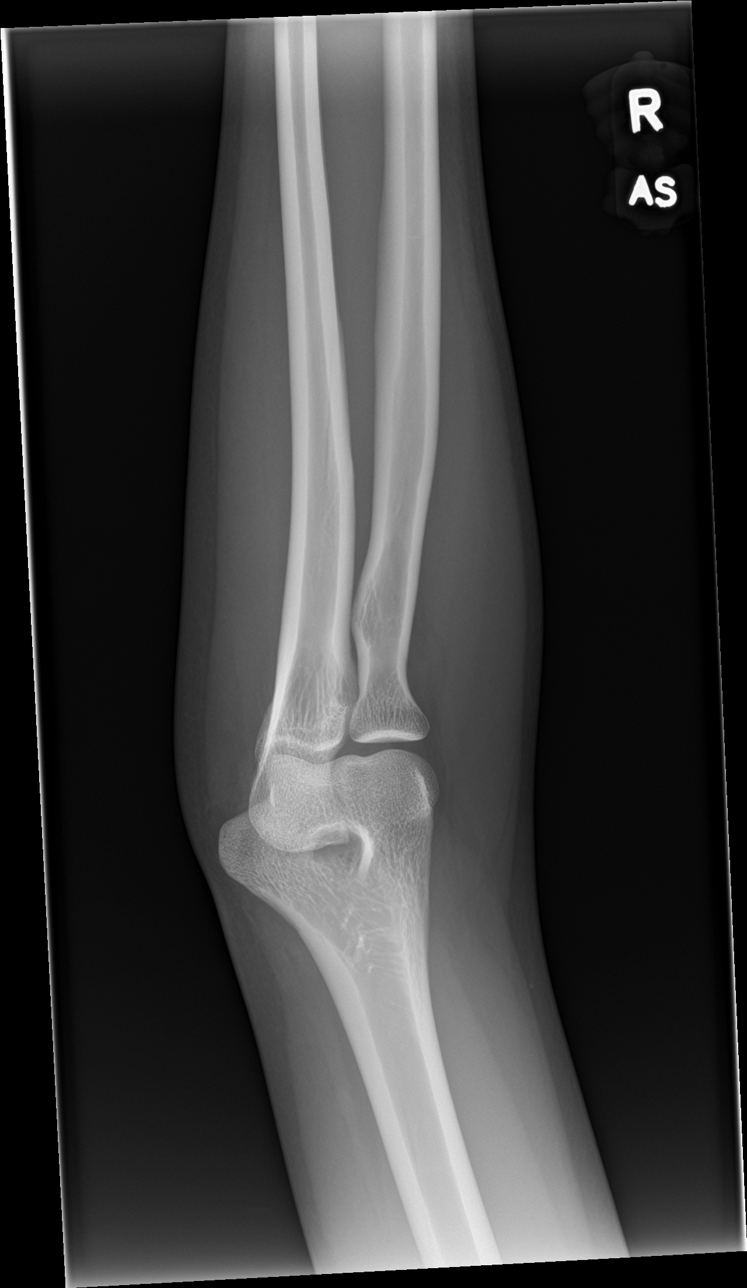

[elbow lat]
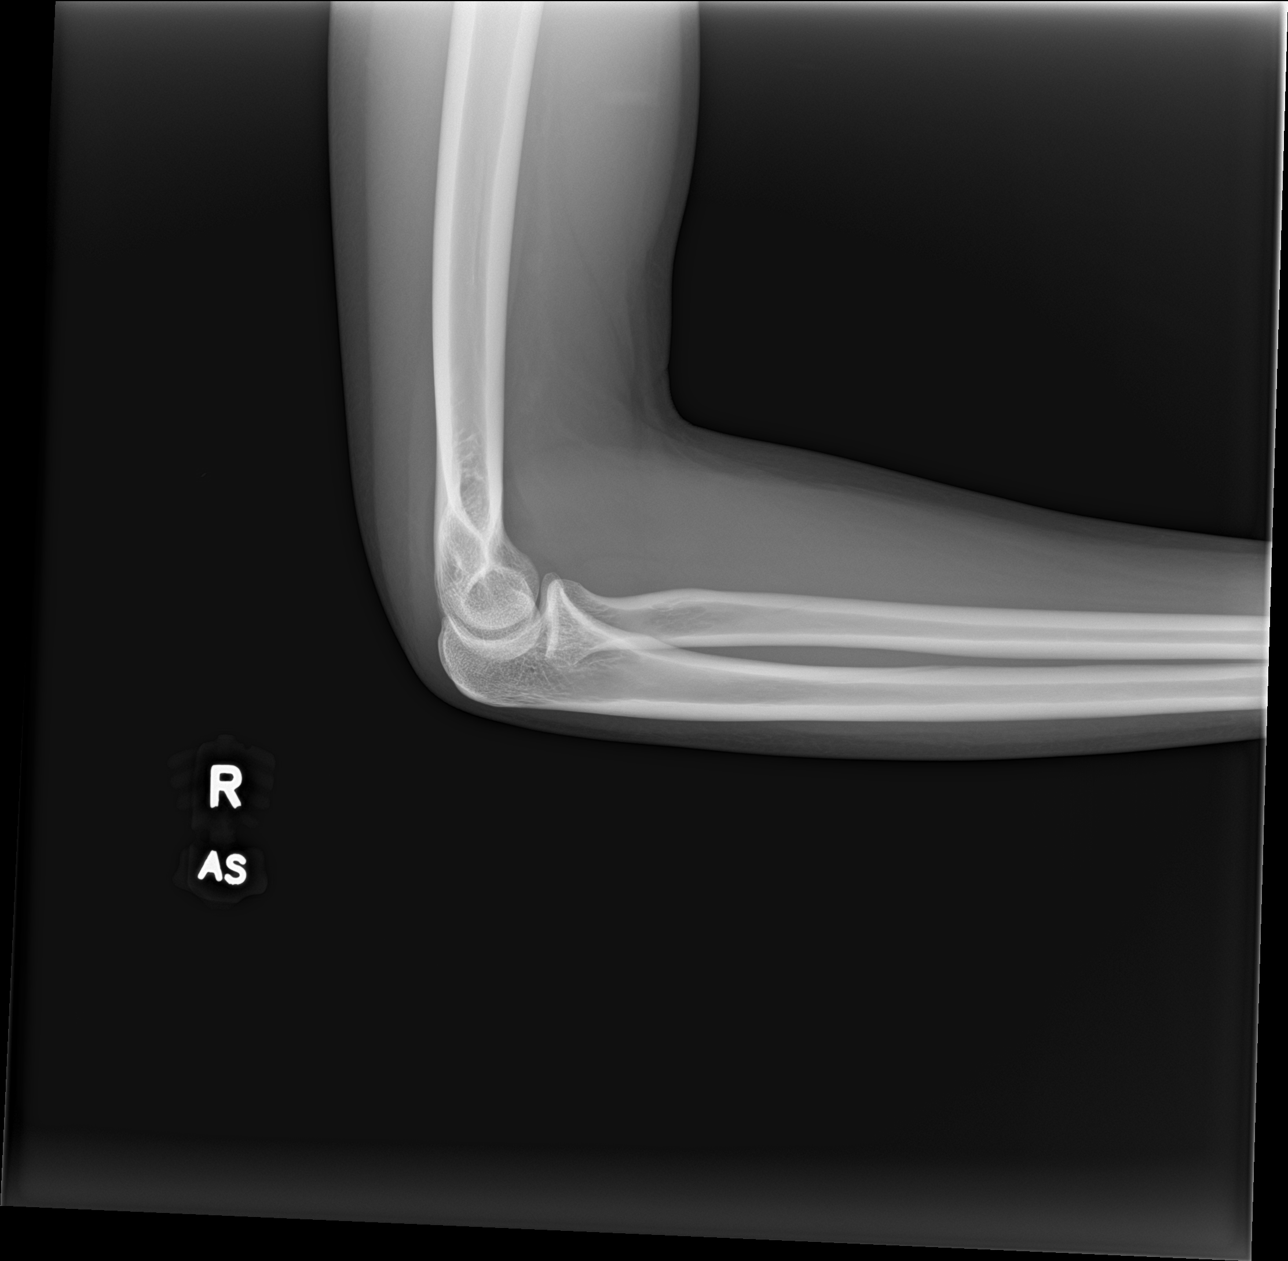

[4 of 4 positions shown; findings below may reference images not displayed]

FINDINGS: There is no evidence of fracture, dislocation, or joint effusion.
There is no evidence of arthropathy or other focal bone abnormality.
Soft tissues are unremarkable.
IMPRESSION: Negative.
